# Patient Record
Sex: Male | Born: 1975 | Race: Black or African American | Hispanic: No | Smoking: Current every day smoker
Health system: Southern US, Community
[De-identification: ages and names within clinical notes are randomized; demographics above are authoritative.]

## PROBLEM LIST (undated history)

## (undated) DIAGNOSIS — G43909 Migraine, unspecified, not intractable, without status migrainosus: Secondary | ICD-10-CM

## (undated) DIAGNOSIS — M1990 Unspecified osteoarthritis, unspecified site: Secondary | ICD-10-CM

## (undated) DIAGNOSIS — F32A Depression, unspecified: Secondary | ICD-10-CM

## (undated) HISTORY — DX: Migraine, unspecified, not intractable, without status migrainosus: G43.909

## (undated) HISTORY — DX: Depression, unspecified: F32.A

## (undated) HISTORY — DX: Unspecified osteoarthritis, unspecified site: M19.90

---

## 2000-11-21 ENCOUNTER — Encounter: Payer: Self-pay | Admitting: Emergency Medicine

## 2000-11-21 ENCOUNTER — Emergency Department (HOSPITAL_COMMUNITY): Admission: EM | Admit: 2000-11-21 | Discharge: 2000-11-21 | Payer: Self-pay | Admitting: Emergency Medicine

## 2000-11-23 ENCOUNTER — Emergency Department (HOSPITAL_COMMUNITY): Admission: EM | Admit: 2000-11-23 | Discharge: 2000-11-23 | Payer: Self-pay | Admitting: Emergency Medicine

## 2000-11-29 ENCOUNTER — Emergency Department (HOSPITAL_COMMUNITY): Admission: EM | Admit: 2000-11-29 | Discharge: 2000-11-29 | Payer: Self-pay | Admitting: Emergency Medicine

## 2005-03-07 ENCOUNTER — Emergency Department (HOSPITAL_COMMUNITY): Admission: EM | Admit: 2005-03-07 | Discharge: 2005-03-07 | Payer: Self-pay | Admitting: Family Medicine

## 2008-02-26 ENCOUNTER — Emergency Department (HOSPITAL_COMMUNITY): Admission: EM | Admit: 2008-02-26 | Discharge: 2008-02-26 | Payer: Self-pay | Admitting: Family Medicine

## 2011-06-25 ENCOUNTER — Emergency Department (HOSPITAL_COMMUNITY)
Admission: EM | Admit: 2011-06-25 | Discharge: 2011-06-25 | Disposition: A | Discharge status: Home / Self Care | Payer: Medicaid Other | Source: Home / Self Care | Attending: Emergency Medicine | Admitting: Emergency Medicine

## 2011-06-25 ENCOUNTER — Emergency Department (INDEPENDENT_AMBULATORY_CARE_PROVIDER_SITE_OTHER): Payer: Medicaid Other

## 2011-06-25 ENCOUNTER — Encounter (HOSPITAL_COMMUNITY): Payer: Self-pay | Admitting: Emergency Medicine

## 2011-06-25 DIAGNOSIS — S43429A Sprain of unspecified rotator cuff capsule, initial encounter: Secondary | ICD-10-CM

## 2011-06-25 DIAGNOSIS — S46019A Strain of muscle(s) and tendon(s) of the rotator cuff of unspecified shoulder, initial encounter: Secondary | ICD-10-CM

## 2011-06-25 MED ORDER — TRAMADOL HCL 50 MG PO TABS
100.0000 mg | ORAL_TABLET | Freq: Three times a day (TID) | ORAL | Status: AC | PRN
Start: 2011-06-25 — End: 2011-07-05

## 2011-06-25 MED ORDER — MELOXICAM 15 MG PO TABS: 15.0000 mg | ORAL_TABLET | Freq: Every day | ORAL | Status: DC

## 2011-06-25 NOTE — ED Notes (Signed)
Pt having pain in right shoulder for 2 weeks. It hurts mostly with movement. Pt was helping move a table 2 weeks ago when the other person dropped their side and his right arm was pulled down.

## 2011-06-25 NOTE — ED Provider Notes (Signed)
Chief Complaint  Patient presents with  . Shoulder Pain    History of Present Illness:   Mr. Matthew Grimes is a 36 year old male who injured his shoulders 2 weeks ago. He was helping his brother lift a heavy piece of furniture when his brother slipped, then all the weight of the piece of furniture fell on his right arm. Thereafter he developed generalized pain in the shoulder with pain on any movement particularly abduction. He notes weakness of abduction. There is no pain radiating down the arm, numbness, tingling, or weakness.  Review of Systems:  Other than noted above, the patient denies any of the following symptoms: Systemic:  No fevers, chills, sweats, or aches.  No fatigue or tiredness. Musculoskeletal:  No joint pain, arthritis, bursitis, swelling, back pain, or neck pain. Neurological:  No muscular weakness, paresthesias, headache, or trouble with speech or coordination.  No dizziness.   PMFSH:  Past medical history, family history, social history, meds, and allergies were reviewed.  Physical Exam:   Vital signs:  BP 142/83  Pulse 49  Temp(Src) 97.8 F (36.6 C) (Oral)  Resp 12  SpO2 99% Gen:  Alert and oriented times 3.  In no distress. Musculoskeletal: There was diffuse pain to palpation over the entire shoulder. The shoulder had a limited active range of motion with pain, but a full passive range of motion with slight pain. Muscle strength was diminished for abduction. Impingement signs are positive. Otherwise, all joints had a full a ROM with no swelling, bruising or deformity.  No edema, pulses full. Extremities were warm and pink.  Capillary refill was brisk.  Skin:  Clear, warm and dry.  No rash. Neuro:  Alert and oriented times 3.  Muscle strength was normal.  Sensation was intact to light touch.   Radiology:  Dg Shoulder Right  06/25/2011  *RADIOLOGY REPORT*  Clinical Data: 36 year old male with right shoulder pain.  RIGHT SHOULDER - 2+ VIEW  Comparison: None  Findings: There  is no evidence of acute bony abnormality. There is no evidence of acute fracture, subluxation, or dislocation. No focal bony lesions are identified. The visualized right hemithorax is unremarkable.  IMPRESSION: Unremarkable right shoulder.  Original Report Authenticated By: Rosendo Gros, M.D.    Assessment:   Diagnoses that have been ruled out:  None  Diagnoses that are still under consideration:  None  Final diagnoses:  Rotator cuff strain    Plan:   1.  The following meds were prescribed:   New Prescriptions   MELOXICAM (MOBIC) 15 MG TABLET    Take 1 tablet (15 mg total) by mouth daily.   TRAMADOL (ULTRAM) 50 MG TABLET    Take 2 tablets (100 mg total) by mouth every 8 (eight) hours as needed for pain.   2.  The patient was instructed in symptomatic care, including rest and activity, elevation, application of ice and compression.  Appropriate handouts were given. 3.  The patient was told to return if becoming worse in any way, if no better in 3 or 4 days, and given some red flag symptoms that would indicate earlier return.   4.  The patient was told to follow up with Dr. Isaias Cowman in one to 2 days.   Roque Lias, MD 06/25/11 [phone]

## 2011-06-25 NOTE — Discharge Instructions (Signed)
Ligament Sprain Ligaments are tough, fibrous tissues that hold bones together at the joints. A sprain can occur when a ligament is stretched. This injury may take several weeks to heal. HOME CARE INSTRUCTIONS   Rest the injured area for as long as directed by your caregiver. Then slowly start using the joint as directed by your caregiver and as the pain allows.   Keep the affected joint raised if possible to lessen swelling.   Apply ice for 15 to 20 minutes to the injured area every couple hours for the first half day, then 3 to 4 times per day for the first 48 hours. Put the ice in a plastic bag and place a towel between the bag of ice and your skin.   Wear any splinting, casting, or elastic bandage applications as instructed.   Only take over-the-counter or prescription medicines for pain, discomfort, or fever as directed by your caregiver. Do not use aspirin immediately after the injury unless instructed by your caregiver. Aspirin can cause increased bleeding and bruising of the tissues.   If you were given crutches, continue to use them as instructed and do not resume weight bearing on the affected extremity until instructed.  SEEK MEDICAL CARE IF:   Your bruising, swelling, or pain increases.   You have cold and numb fingers or toes if your arm or leg was injured.  SEEK IMMEDIATE MEDICAL CARE IF:   Your toes are numb or blue if your leg was injured.   Your fingers are numb or blue if your arm was injured.   Your pain is not responding to medicines and continues to stay the same or gets worse.  MAKE SURE YOU:   Understand these instructions.   Will watch your condition.   Will get help right away if you are not doing well or get worse.  Document Released: 04/14/2000 Document Revised: 12/28/2010 Document Reviewed: 02/11/2008 [facility] Patient Information 2012 [place], [place].

## 2011-12-12 ENCOUNTER — Encounter (HOSPITAL_COMMUNITY): Payer: Self-pay | Admitting: *Deleted

## 2011-12-12 ENCOUNTER — Emergency Department (HOSPITAL_COMMUNITY)
Admission: EM | Admit: 2011-12-12 | Discharge: 2011-12-13 | Disposition: A | Discharge status: Home / Self Care | Payer: Medicaid Other | Attending: Emergency Medicine | Admitting: Emergency Medicine

## 2011-12-12 DIAGNOSIS — L723 Sebaceous cyst: Secondary | ICD-10-CM | POA: Insufficient documentation

## 2011-12-12 DIAGNOSIS — R229 Localized swelling, mass and lump, unspecified: Secondary | ICD-10-CM

## 2011-12-12 NOTE — ED Notes (Signed)
Pt has possible abscess to right hip area

## 2011-12-12 NOTE — ED Notes (Signed)
Rt hip insect bite 2 days ago.  C/o pain

## 2011-12-12 NOTE — ED Provider Notes (Signed)
History     CSN: [id number]  Arrival date & time 12/12/11  2120   First MD Initiated Contact with Patient 12/12/11 2200      Chief Complaint  Patient presents with  . Insect Bite    (Consider location/radiation/quality/duration/timing/severity/associated sxs/prior treatment) HPI Comments: Patient with no significant past medical history presents emergency department with chief complaint of right insect bite.  He states that he felt a funny sensation in his skin at his right hip while he was sleeping at night and that over the last 2 days it is gradually worsened.  He denies a history of abscesses and does not recall seeing any sort of insect.  Patient states his skin is intact but there is a bump on his right hip that is tender to palpation and it is not allow him to sleep on that side.  Severity of the pain is rated at 7/10.  Patient denies having any comorbidities, fever, night sweats, chills, rash, nuchal rigidity, myalgias, headache, change in vision.  No other complaints at this time.  The history is provided by the patient.    History reviewed. No pertinent past medical history.  History reviewed. No pertinent past surgical history.  No family history on file.  History  Substance Use Topics  . Smoking status: Never Smoker   . Smokeless tobacco: Not on file  . Alcohol Use: No      Review of Systems  All other systems reviewed and are negative.    Allergies  Review of patient's allergies indicates no known allergies.  Home Medications  No current outpatient prescriptions on file.  BP 139/92  Pulse 60  Temp 98 F (36.7 C) (Oral)  Resp 16  SpO2 100%  Physical Exam  Nursing note and vitals reviewed. Constitutional: He is oriented to person, place, and time. He appears well-developed and well-nourished. No distress.  HENT:  Head: Normocephalic and atraumatic.  Eyes: Conjunctivae and EOM are normal.  Neck: Normal range of motion.  Pulmonary/Chest: Effort  normal.  Musculoskeletal: Normal range of motion.  Neurological: He is alert and oriented to person, place, and time.  Skin: Skin is warm and dry. No rash noted. He is not diaphoretic.       Small Superficial nodule located on right hip that is tender to palpation.  Nodule measures approximately 3-50mm round, tender to palpation, not fixated and with questionable fluctuance.  No surrounding erythema.  Psychiatric: He has a normal mood and affect. His behavior is normal.    ED Course  Procedures (including critical care time)  Labs Reviewed - No data to display No results found.   No diagnosis found.  INCISION & cyst removal Performed by: Jaci Carrel Consent: Verbal consent obtained. Risks and benefits: risks, benefits and alternatives were discussed Type: Cyst removal  Body area: Right hip  Anesthesia: local infiltration  Local anesthetic: lidocaine 2% w epinephrine  Anesthetic total: 1.5 ml  Drainage amount: none- Removed cyst without complications  Patient tolerance: Patient tolerated the procedure well with no immediate complications.     MDM  Superficial tender nodule   Nodule excised and removed. Recommended Follow up with PCP if another nodule develops, wound re-check in 3-7 days.Strict return precautions discussed. Non concerning for CA d/t presentation, pt age, nodule size and history.          Jaci Carrel, [place] 12/12/11 2323

## 2011-12-15 NOTE — ED Provider Notes (Signed)
Medical screening examination/treatment/procedure(s) were performed by non-physician practitioner and as supervising physician I was immediately available for consultation/collaboration.  Flint Melter, MD 12/15/11 [phone]

## 2012-02-05 ENCOUNTER — Encounter (HOSPITAL_COMMUNITY): Payer: Self-pay | Admitting: *Deleted

## 2012-02-05 ENCOUNTER — Emergency Department (HOSPITAL_COMMUNITY)
Admission: EM | Admit: 2012-02-05 | Discharge: 2012-02-05 | Disposition: A | Discharge status: Home Health | Payer: Medicaid Other | Attending: Emergency Medicine | Admitting: Emergency Medicine

## 2012-02-05 DIAGNOSIS — N390 Urinary tract infection, site not specified: Secondary | ICD-10-CM

## 2012-02-05 LAB — CBC WITH DIFFERENTIAL/PLATELET
Basophils Relative: 0 % (ref 0–1)
HCT: 35.3 % — ABNORMAL LOW (ref 39.0–52.0)
Hemoglobin: 12.6 g/dL — ABNORMAL LOW (ref 13.0–17.0)
MCH: 31.1 pg (ref 26.0–34.0)
MCHC: 35.7 g/dL (ref 30.0–36.0)
MCV: 87.2 fL (ref 78.0–100.0)
Monocytes Absolute: 0.7 10*3/uL (ref 0.1–1.0)
Monocytes Relative: 5 % (ref 3–12)
Neutro Abs: 11.3 10*3/uL — ABNORMAL HIGH (ref 1.7–7.7)

## 2012-02-05 LAB — COMPREHENSIVE METABOLIC PANEL
Albumin: 4.4 g/dL (ref 3.5–5.2)
BUN: 9 mg/dL (ref 6–23)
Chloride: 103 mEq/L (ref 96–112)
Creatinine, Ser: 0.76 mg/dL (ref 0.50–1.35)
GFR calc non Af Amer: >90 mL/min (ref >90)
Total Bilirubin: 0.5 mg/dL (ref 0.3–1.2)

## 2012-02-05 LAB — URINALYSIS, ROUTINE W REFLEX MICROSCOPIC
Bilirubin Urine: NEGATIVE
Glucose, UA: NEGATIVE mg/dL
Ketones, ur: 15 mg/dL — AB
Nitrite: NEGATIVE
Protein, ur: 100 mg/dL — AB
Specific Gravity, Urine: 1.017 (ref 1.005–1.030)
Urobilinogen, UA: 1 mg/dL (ref 0.0–1.0)
pH: 6 (ref 5.0–8.0)

## 2012-02-05 LAB — URINE MICROSCOPIC-ADD ON

## 2012-02-05 MED ORDER — METRONIDAZOLE 500 MG PO TABS
2000.0000 mg | ORAL_TABLET | Freq: Once | ORAL | Status: AC
  Administered 2012-02-05: 2000 mg via ORAL
  Filled 2012-02-05: qty 4

## 2012-02-05 MED ORDER — CIPROFLOXACIN HCL 500 MG PO TABS: 500.0000 mg | ORAL_TABLET | Freq: Two times a day (BID) | ORAL | Status: DC

## 2012-02-05 MED ORDER — AZITHROMYCIN 250 MG PO TABS
1000.0000 mg | ORAL_TABLET | Freq: Once | ORAL | Status: AC
  Administered 2012-02-05: 1000 mg via ORAL
  Filled 2012-02-05: qty 4

## 2012-02-05 MED ORDER — CEFTRIAXONE SODIUM 250 MG IJ SOLR
250.0000 mg | Freq: Once | INTRAMUSCULAR | Status: AC
  Administered 2012-02-05: 250 mg via INTRAMUSCULAR
  Filled 2012-02-05: qty 250

## 2012-02-05 NOTE — ED Notes (Signed)
Lower abd pain and bloody urine just today.  No nv or diarrhea

## 2012-02-05 NOTE — ED Notes (Signed)
He is also c/o bloody urine

## 2012-02-05 NOTE — ED Provider Notes (Signed)
History     CSN: [id number]  Arrival date & time 02/05/12  1646   First MD Initiated Contact with Patient 02/05/12 2106      Chief Complaint  Patient presents with  . Abdominal Pain   HPI  History provided by the patient. Patient is a 36 year old male with no significant PMH who presents with complaints of lower abdomen discomfort and hematuria. Patient reports having slight lower abdomen discomfort and occasional pains for the past several days. Today patient states he noticed blood in his urine for the first time. Patient denies having similar symptoms previously. Patient is sexually active with one partner. He has no prior history of STD. He denies any penile discharge, testicle tenderness or testicle swelling. He denies any rash of the skin. He denies any flank pain, nausea or vomiting. He denies any fever, chills or sweats.    History reviewed. No pertinent past medical history.  History reviewed. No pertinent past surgical history.  No family history on file.  History  Substance Use Topics  . Smoking status: Never Smoker   . Smokeless tobacco: Not on file  . Alcohol Use: No      Review of Systems  Constitutional: Negative for fever, chills, diaphoresis and appetite change.  Cardiovascular: Negative for chest pain.  Gastrointestinal: Positive for abdominal pain. Negative for nausea, vomiting, diarrhea, constipation, blood in stool and rectal pain.  Genitourinary: Positive for hematuria. Negative for dysuria, frequency, flank pain, discharge, penile swelling, scrotal swelling, penile pain and testicular pain.  Skin: Negative for rash.    Allergies  Review of patient's allergies indicates no known allergies.  Home Medications  No current outpatient prescriptions on file.  BP 123/82  Pulse 64  Temp 98.4 F (36.9 C) (Oral)  Resp 18  SpO2 100%  Physical Exam  Nursing note and vitals reviewed. Constitutional: He is oriented to person, place, and time. He appears  well-developed and well-nourished. No distress.  HENT:  Head: Normocephalic.  Cardiovascular: Normal rate and regular rhythm.   Pulmonary/Chest: Effort normal and breath sounds normal. No respiratory distress. He has no wheezes. He has no rales.  Abdominal: Soft. He exhibits no distension and no mass. There is tenderness in the suprapubic area. There is no rigidity, no rebound, no guarding, no CVA tenderness, no tenderness at McBurney's point and negative Murphy's sign.  Genitourinary: Rectum normal, prostate normal, testes normal and penis normal. Right testis shows no mass, no swelling and no tenderness. Left testis shows no mass, no swelling and no tenderness. No penile tenderness.  Neurological: He is alert and oriented to person, place, and time.  Skin: Skin is warm. No rash noted.  Psychiatric: He has a normal mood and affect. His behavior is normal.    ED Course  Procedures  Results for orders placed during the hospital encounter of 02/05/12  URINALYSIS, ROUTINE W REFLEX MICROSCOPIC      Component Value Range   Color, Urine RED (*) YELLOW   APPearance CLOUDY (*) CLEAR   Specific Gravity, Urine 1.017  1.005 - 1.030   pH 6.0  5.0 - 8.0   Glucose, UA NEGATIVE  NEGATIVE mg/dL   Hgb urine dipstick LARGE (*) NEGATIVE   Bilirubin Urine NEGATIVE  NEGATIVE   Ketones, ur 15 (*) NEGATIVE mg/dL   Protein, ur 213 (*) NEGATIVE mg/dL   Urobilinogen, UA 1.0  0.0 - 1.0 mg/dL   Nitrite NEGATIVE  NEGATIVE   Leukocytes, UA MODERATE (*) NEGATIVE  CBC WITH DIFFERENTIAL  Component Value Range   WBC 14.2 (*) 4.0 - 10.5 K/uL   RBC 4.05 (*) 4.22 - 5.81 MIL/uL   Hemoglobin 12.6 (*) 13.0 - 17.0 g/dL   HCT 95.6 (*) 21.3 - 08.6 %   MCV 87.2  78.0 - 100.0 fL   MCH 31.1  26.0 - 34.0 pg   MCHC 35.7  30.0 - 36.0 g/dL   RDW 57.8  46.9 - 62.9 %   Platelets 218  150 - 400 K/uL   Neutrophils Relative 79 (*) 43 - 77 %   Neutro Abs 11.3 (*) 1.7 - 7.7 K/uL   Lymphocytes Relative 15  12 - 46 %   Lymphs  Abs 2.1  0.7 - 4.0 K/uL   Monocytes Relative 5  3 - 12 %   Monocytes Absolute 0.7  0.1 - 1.0 K/uL   Eosinophils Relative 1  0 - 5 %   Eosinophils Absolute 0.2  0.0 - 0.7 K/uL   Basophils Relative 0  0 - 1 %   Basophils Absolute 0.0  0.0 - 0.1 K/uL  COMPREHENSIVE METABOLIC PANEL      Component Value Range   Sodium 136  135 - 145 mEq/L   Potassium 3.7  3.5 - 5.1 mEq/L   Chloride 103  96 - 112 mEq/L   CO2 23  19 - 32 mEq/L   Glucose, Bld 88  70 - 99 mg/dL   BUN 9  6 - 23 mg/dL   Creatinine, Ser 5.28  0.50 - 1.35 mg/dL   Calcium 9.4  8.4 - 41.3 mg/dL   Total Protein 7.3  6.0 - 8.3 g/dL   Albumin 4.4  3.5 - 5.2 g/dL   AST 13  0 - 37 U/L   ALT 8  0 - 53 U/L   Alkaline Phosphatase 55  39 - 117 U/L   Total Bilirubin 0.5  0.3 - 1.2 mg/dL   GFR calc non Af Amer >90  >90 mL/min   GFR calc Af Amer >90  >90 mL/min  URINE MICROSCOPIC-ADD ON      Component Value Range   Squamous Epithelial / LPF FEW (*) RARE   WBC, UA TOO NUMEROUS TO COUNT  <3 WBC/hpf   RBC / HPF TOO NUMEROUS TO COUNT  <3 RBC/hpf   Bacteria, UA FEW (*) RARE        1. UTI (lower urinary tract infection)       MDM  9:20PM patient seen and evaluated. Patient appears comfortable in no acute distress. Abdomen is soft with very mild suprapubic tenderness. No CVA tenderness.   Patient with normal renal function. Patient does not appear in any significant discomfort. Doubt kidney stone. UA is concerning for UTI. Given patient's age I have concerned for possible STD. GC Chlamydia test were sent. Will give treatment for GC Chlamydia and Trichomonas today. Patient given instructions to followup with [facility] department. We'll also give prescription for Cipro x7 days and urology referral.     Angus Seller, PA 02/06/12 [phone]

## 2012-02-08 LAB — URINE CULTURE: Colony Count: 45000

## 2012-02-08 NOTE — ED Provider Notes (Signed)
Medical screening examination/treatment/procedure(s) were performed by non-physician practitioner and as supervising physician I was immediately available for consultation/collaboration.   Joya Gaskins, MD 02/08/12 [phone]

## 2012-02-09 NOTE — ED Notes (Signed)
+   urine  Patient treated appropriately -sensitive to same-chart appended per protocol MD.  

## 2012-12-04 ENCOUNTER — Encounter (HOSPITAL_COMMUNITY): Payer: Self-pay | Admitting: Emergency Medicine

## 2012-12-04 ENCOUNTER — Emergency Department (HOSPITAL_COMMUNITY)
Admission: EM | Admit: 2012-12-04 | Discharge: 2012-12-04 | Disposition: A | Discharge status: Home / Self Care | Payer: Medicaid Other | Attending: Emergency Medicine | Admitting: Emergency Medicine

## 2012-12-04 DIAGNOSIS — K0889 Other specified disorders of teeth and supporting structures: Secondary | ICD-10-CM

## 2012-12-04 DIAGNOSIS — K089 Disorder of teeth and supporting structures, unspecified: Secondary | ICD-10-CM | POA: Insufficient documentation

## 2012-12-04 MED ORDER — HYDROCODONE-ACETAMINOPHEN 5-325 MG PO TABS: 1.0000 | ORAL_TABLET | ORAL | Status: DC | PRN

## 2012-12-04 MED ORDER — PENICILLIN V POTASSIUM 500 MG PO TABS: 500.0000 mg | ORAL_TABLET | Freq: Four times a day (QID) | ORAL | Status: AC

## 2012-12-04 NOTE — ED Provider Notes (Signed)
  Medical screening examination/treatment/procedure(s) were performed by non-physician practitioner and as supervising physician I was immediately available for consultation/collaboration.    Rickiya Picariello, MD 12/04/12 1600 

## 2012-12-04 NOTE — ED Notes (Signed)
Pt c/o left sided dental pain x 3 days.

## 2012-12-04 NOTE — ED Provider Notes (Signed)
  CSN: [id number]     Arrival date & time 12/04/12  0715 History     First MD Initiated Contact with Patient 12/04/12 0831     Chief Complaint  Patient presents with  . Abscess  . Dental Pain   (Consider location/radiation/quality/duration/timing/severity/associated sxs/prior Treatment) The history is provided by the patient and medical records.   Patient resents to the ED for left upper and lower dental pain x3 days. Patient states pain started after eating, and is exacerbated by chewing on the affected side.  Denies any breakage of teeth or mucosal injury.  Denies any numbness or paresthesias of face. No fevers, sweats, or chills. Patient is not currently established with a dentist.  No meds taken prior to arrival. No hx of dental abscess.  History reviewed. No pertinent past medical history. History reviewed. No pertinent past surgical history. History reviewed. No pertinent family history. History  Substance Use Topics  . Smoking status: Never Smoker   . Smokeless tobacco: Not on file  . Alcohol Use: No    Review of Systems  HENT: Positive for dental problem.   All other systems reviewed and are negative.    Allergies  Review of patient's allergies indicates no known allergies.  Home Medications  No current outpatient prescriptions on file. BP 131/79  Pulse 61  Temp(Src) 98.1 F (36.7 C) (Oral)  Resp 18  SpO2 99%   Physical Exam  Nursing note and vitals reviewed. Constitutional: He is oriented to person, place, and time. He appears well-developed and well-nourished.  HENT:  Head: Normocephalic and atraumatic. No trismus in the jaw.  Mouth/Throat: Uvula is midline, oropharynx is clear and moist and mucous membranes are normal. No oral lesions. Abnormal dentition. Dental caries present. No oropharyngeal exudate, posterior oropharyngeal edema, posterior oropharyngeal erythema or tonsillar abscesses.    Teeth largely in poor dentition, multiples cavities present,  left upper pre-molar broken and flush with gum, surrounding gingiva swollen and discolored, handling secretions appropriately, no trismus  Eyes: Conjunctivae and EOM are normal.  Neck: Normal range of motion. Neck supple.  Cardiovascular: Normal rate, regular rhythm and normal heart sounds.   Pulmonary/Chest: Effort normal and breath sounds normal.  Musculoskeletal: Normal range of motion.  Neurological: He is alert and oriented to person, place, and time.  Skin: Skin is warm and dry.  Psychiatric: He has a normal mood and affect.    ED Course   Procedures (including critical care time)  Labs Reviewed - No data to display No results found.  1. Pain, dental     MDM   Dental pain with concern for early dental abscess.  Rx pen VK and vicodin.  FU with dentist-- community lists given.  Discussed plan with pt, he agreed.  Return precautions advised.  Garlon Hatchet, PA-C 12/04/12 [phone]

## 2014-12-17 ENCOUNTER — Encounter (HOSPITAL_COMMUNITY): Payer: Self-pay | Admitting: Family Medicine

## 2014-12-17 ENCOUNTER — Emergency Department (HOSPITAL_COMMUNITY): Payer: Medicaid Other

## 2014-12-17 ENCOUNTER — Emergency Department (HOSPITAL_COMMUNITY)
Admission: EM | Admit: 2014-12-17 | Discharge: 2014-12-17 | Disposition: A | Discharge status: Home / Self Care | Payer: Medicaid Other | Attending: Emergency Medicine | Admitting: Emergency Medicine

## 2014-12-17 DIAGNOSIS — R3919 Other difficulties with micturition: Secondary | ICD-10-CM | POA: Diagnosis not present

## 2014-12-17 DIAGNOSIS — R1084 Generalized abdominal pain: Secondary | ICD-10-CM | POA: Insufficient documentation

## 2014-12-17 DIAGNOSIS — R300 Dysuria: Secondary | ICD-10-CM | POA: Diagnosis not present

## 2014-12-17 DIAGNOSIS — R109 Unspecified abdominal pain: Secondary | ICD-10-CM | POA: Diagnosis present

## 2014-12-17 DIAGNOSIS — M549 Dorsalgia, unspecified: Secondary | ICD-10-CM | POA: Insufficient documentation

## 2014-12-17 DIAGNOSIS — R112 Nausea with vomiting, unspecified: Secondary | ICD-10-CM | POA: Diagnosis not present

## 2014-12-17 DIAGNOSIS — R197 Diarrhea, unspecified: Secondary | ICD-10-CM | POA: Diagnosis not present

## 2014-12-17 LAB — COMPREHENSIVE METABOLIC PANEL
ALK PHOS: 53 U/L (ref 38–126)
ALT: 17 U/L (ref 17–63)
ANION GAP: 6 (ref 5–15)
AST: 19 U/L (ref 15–41)
Albumin: 4.2 g/dL (ref 3.5–5.0)
BILIRUBIN TOTAL: 0.5 mg/dL (ref 0.3–1.2)
BUN: 10 mg/dL (ref 6–20)
CALCIUM: 9.7 mg/dL (ref 8.9–10.3)
CO2: 25 mmol/L (ref 22–32)
CREATININE: 0.87 mg/dL (ref 0.61–1.24)
Chloride: 110 mmol/L (ref 101–111)
Glucose, Bld: 73 mg/dL (ref 65–99)
Potassium: 4.4 mmol/L (ref 3.5–5.1)
SODIUM: 141 mmol/L (ref 135–145)
TOTAL PROTEIN: 7.6 g/dL (ref 6.5–8.1)

## 2014-12-17 LAB — CBC
HCT: 42.1 % (ref 39.0–52.0)
HEMOGLOBIN: 14.4 g/dL (ref 13.0–17.0)
MCH: 31.9 pg (ref 26.0–34.0)
MCHC: 34.2 g/dL (ref 30.0–36.0)
MCV: 93.1 fL (ref 78.0–100.0)
PLATELETS: 287 10*3/uL (ref 150–400)
RBC: 4.52 MIL/uL (ref 4.22–5.81)
RDW: 15 % (ref 11.5–15.5)
WBC: 7.1 10*3/uL (ref 4.0–10.5)

## 2014-12-17 LAB — URINALYSIS, ROUTINE W REFLEX MICROSCOPIC
Bilirubin Urine: NEGATIVE
GLUCOSE, UA: NEGATIVE mg/dL
HGB URINE DIPSTICK: NEGATIVE
KETONES UR: 15 mg/dL — AB
LEUKOCYTES UA: NEGATIVE
Nitrite: NEGATIVE
PROTEIN: NEGATIVE mg/dL
Specific Gravity, Urine: 1.028 (ref 1.005–1.030)
UROBILINOGEN UA: 1 mg/dL (ref 0.0–1.0)
pH: 6 (ref 5.0–8.0)

## 2014-12-17 LAB — LIPASE, BLOOD: Lipase: 31 U/L (ref 22–51)

## 2014-12-17 MED ORDER — NAPROXEN 500 MG PO TABS: 500.0000 mg | ORAL_TABLET | Freq: Two times a day (BID) | ORAL | Status: DC

## 2014-12-17 MED ORDER — SODIUM CHLORIDE 0.9 % IV BOLUS (SEPSIS)
250.0000 mL | Freq: Once | INTRAVENOUS | Status: AC
Start: 2014-12-17 — End: 2014-12-17
  Administered 2014-12-17: 250 mL via INTRAVENOUS

## 2014-12-17 MED ORDER — IOHEXOL 300 MG/ML  SOLN
25.0000 mL | Freq: Once | INTRAMUSCULAR | Status: AC | PRN
  Administered 2014-12-17: 25 mL via ORAL

## 2014-12-17 MED ORDER — IOHEXOL 300 MG/ML  SOLN: 25.0000 mL | Freq: Once | INTRAMUSCULAR | Status: DC | PRN

## 2014-12-17 MED ORDER — PROMETHAZINE HCL 25 MG PO TABS: 25.0000 mg | ORAL_TABLET | Freq: Four times a day (QID) | ORAL | Status: DC | PRN

## 2014-12-17 MED ORDER — ONDANSETRON HCL 4 MG/2ML IJ SOLN
4.0000 mg | Freq: Once | INTRAMUSCULAR | Status: AC
  Administered 2014-12-17: 4 mg via INTRAVENOUS
  Filled 2014-12-17: qty 2

## 2014-12-17 MED ORDER — SODIUM CHLORIDE 0.9 % IV SOLN: INTRAVENOUS | Status: DC

## 2014-12-17 MED ORDER — IOHEXOL 300 MG/ML  SOLN
100.0000 mL | Freq: Once | INTRAMUSCULAR | Status: AC | PRN
  Administered 2014-12-17: 100 mL via INTRAVENOUS

## 2014-12-17 MED ORDER — FENTANYL CITRATE (PF) 100 MCG/2ML IJ SOLN
50.0000 ug | Freq: Once | INTRAMUSCULAR | Status: AC
Start: 2014-12-17 — End: 2014-12-17
  Administered 2014-12-17: 50 ug via INTRAVENOUS
  Filled 2014-12-17: qty 2

## 2014-12-17 NOTE — ED Notes (Signed)
Pt here for abd pain aand back pain. sts his kidneys hurt. sts also right groin pain, sts N,V,D.

## 2014-12-17 NOTE — ED Notes (Signed)
Patient transported to CT 

## 2014-12-17 NOTE — ED Provider Notes (Signed)
CSN: [id number]     Arrival date & time 12/17/14  1450 History   First MD Initiated Contact with Patient 12/17/14 1614     Chief Complaint  Patient presents with  . Abdominal Pain  . Back Pain     (Consider location/radiation/quality/duration/timing/severity/associated sxs/prior Treatment) Patient is a 39 y.o. male presenting with abdominal pain and back pain. The history is provided by the patient and a significant other.  Abdominal Pain Associated symptoms: diarrhea, dysuria, nausea and vomiting   Associated symptoms: no chest pain, no fever and no hematuria   Back Pain Associated symptoms: abdominal pain and dysuria   Associated symptoms: no chest pain, no fever and no headaches    patient with complaint of lower abdominal pain worse with urination radiating to bilateral back area for 3 days. Currently 5 out of 10 at its worse is 10 out of 10 no fever no hematuria but does have discomfort with urination. Associated with nausea vomiting and diarrhea vomited once today no diarrhea today. No blood in either one. No history of similar problems. Pain is described as sharp.  History reviewed. No pertinent past medical history. History reviewed. No pertinent past surgical history. History reviewed. No pertinent family history. Social History  Substance Use Topics  . Smoking status: Never Smoker   . Smokeless tobacco: None  . Alcohol Use: No    Review of Systems  Constitutional: Negative for fever.  HENT: Negative for congestion.   Eyes: Negative for visual disturbance.  Cardiovascular: Negative for chest pain.  Gastrointestinal: Positive for nausea, vomiting, abdominal pain and diarrhea.  Genitourinary: Positive for dysuria and difficulty urinating. Negative for hematuria.  Musculoskeletal: Positive for back pain.  Skin: Negative for rash.  Neurological: Negative for headaches.  Hematological: Does not bruise/bleed easily.  Psychiatric/Behavioral: Negative for confusion.       Allergies  Review of patient's allergies indicates no known allergies.  Home Medications   Prior to Admission medications   Medication Sig Start Date End Date Taking? Authorizing Provider  HYDROcodone-acetaminophen (NORCO/VICODIN) 5-325 MG per tablet Take 1 tablet by mouth every 4 (four) hours as needed for pain. Patient not taking: Reported on 12/17/2014 12/04/12   Garlon Hatchet, PA-C  naproxen (NAPROSYN) 500 MG tablet Take 1 tablet (500 mg total) by mouth 2 (two) times daily. 12/17/14   Vanetta Mulders, MD  promethazine (PHENERGAN) 25 MG tablet Take 1 tablet (25 mg total) by mouth every 6 (six) hours as needed for nausea or vomiting. 12/17/14   Vanetta Mulders, MD   BP 109/74 mmHg  Pulse 46  Temp(Src) 97.8 F (36.6 C) (Oral)  Resp 18  SpO2 99% Physical Exam  Constitutional: He is oriented to person, place, and time. He appears well-developed and well-nourished. No distress.  HENT:  Head: Normocephalic and atraumatic.  Mouth/Throat: Oropharynx is clear and moist.  Eyes: Conjunctivae and EOM are normal. Pupils are equal, round, and reactive to light.  Neck: Normal range of motion. Neck supple.  Cardiovascular: Normal rate, regular rhythm and normal heart sounds.   No murmur heard. Pulmonary/Chest: Effort normal and breath sounds normal. No respiratory distress.  Abdominal: Soft. Bowel sounds are normal. There is no tenderness.  Musculoskeletal: Normal range of motion. He exhibits no edema.  Neurological: He is alert and oriented to person, place, and time. No cranial nerve deficit. He exhibits normal muscle tone. Coordination normal.  Skin: Skin is warm. No rash noted.  Nursing note and vitals reviewed.   ED Course  Procedures (including  critical care time) Labs Review Labs Reviewed  URINALYSIS, ROUTINE W REFLEX MICROSCOPIC (NOT AT [facility]) - Abnormal; Notable for the following:    Ketones, ur 15 (*)    All other components within normal limits  LIPASE, BLOOD   COMPREHENSIVE METABOLIC PANEL  CBC   Results for orders placed or performed during the hospital encounter of 12/17/14  Lipase, blood  Result Value Ref Range   Lipase 31 22 - 51 U/L  Comprehensive metabolic panel  Result Value Ref Range   Sodium 141 135 - 145 mmol/L   Potassium 4.4 3.5 - 5.1 mmol/L   Chloride 110 101 - 111 mmol/L   CO2 25 22 - 32 mmol/L   Glucose, Bld 73 65 - 99 mg/dL   BUN 10 6 - 20 mg/dL   Creatinine, Ser 1.61 0.61 - 1.24 mg/dL   Calcium 9.7 8.9 - 09.6 mg/dL   Total Protein 7.6 6.5 - 8.1 g/dL   Albumin 4.2 3.5 - 5.0 g/dL   AST 19 15 - 41 U/L   ALT 17 17 - 63 U/L   Alkaline Phosphatase 53 38 - 126 U/L   Total Bilirubin 0.5 0.3 - 1.2 mg/dL   GFR calc non Af Amer >60 >60 mL/min   GFR calc Af Amer >60 >60 mL/min   Anion gap 6 5 - 15  CBC  Result Value Ref Range   WBC 7.1 4.0 - 10.5 K/uL   RBC 4.52 4.22 - 5.81 MIL/uL   Hemoglobin 14.4 13.0 - 17.0 g/dL   HCT 04.5 40.9 - 81.1 %   MCV 93.1 78.0 - 100.0 fL   MCH 31.9 26.0 - 34.0 pg   MCHC 34.2 30.0 - 36.0 g/dL   RDW 91.4 78.2 - 95.6 %   Platelets 287 150 - 400 K/uL  Urinalysis, Routine w reflex microscopic (not at [facility])  Result Value Ref Range   Color, Urine YELLOW YELLOW   APPearance CLEAR CLEAR   Specific Gravity, Urine 1.028 1.005 - 1.030   pH 6.0 5.0 - 8.0   Glucose, UA NEGATIVE NEGATIVE mg/dL   Hgb urine dipstick NEGATIVE NEGATIVE   Bilirubin Urine NEGATIVE NEGATIVE   Ketones, ur 15 (A) NEGATIVE mg/dL   Protein, ur NEGATIVE NEGATIVE mg/dL   Urobilinogen, UA 1.0 0.0 - 1.0 mg/dL   Nitrite NEGATIVE NEGATIVE   Leukocytes, UA NEGATIVE NEGATIVE     Imaging Review Ct Abdomen Pelvis W Contrast  12/17/2014   CLINICAL DATA:  Bilateral back pain, nausea, vomiting, diarrhea and constipation for the past 3 days.  EXAM: CT ABDOMEN AND PELVIS WITH CONTRAST  TECHNIQUE: Multidetector CT imaging of the abdomen and pelvis was performed using the standard protocol following bolus administration of intravenous  contrast.  CONTRAST:  OMNIPAQUE IOHEXOL 300 MG/ML  SOLN  COMPARISON:  None.  FINDINGS: 1.6 cm low density mass in the superior aspect of the left lobe of the liver on image number 7, with some peripheral contrast puddling. There is also a 1.8 cm hyperenhancing mass in the superior aspect of the right lobe on image 9. There is a 1.2 cm similar hyper enhancing mass in the lateral segment of the left lobe of the liver on image 14. Also noted are multiple small liver cysts.  Unremarkable spleen, pancreas, gallbladder, adrenal glands, kidneys, urinary bladder and prostate gland. No gastrointestinal abnormalities or enlarged lymph nodes. No evidence of appendicitis. Mild paraseptal emphysema in the left lower lobe. Mild degenerative changes at the L5-S1 level.  IMPRESSION: 1.  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 2. 2 additional hyperenhancing masses in the liver. These are nonspecific and most likely benign. These could be followed with adynamic pre and postcontrast enhanced CT or MR of the liver an 3-6 months. 3. Mild changes of COPD. 4. No acute abnormality.   Electronically Signed   By: Beckie Salts M.D.   On: 12/17/2014 21:43   I have personally reviewed and evaluated these images and lab results as part of my medical decision-making.   EKG Interpretation None      MDM   Final diagnoses:  Generalized abdominal pain      workup for the abdominal pain now without any specific findings. CT of the abdomen was negative no leukocytosis no evidence for urinary tract infection no liver function test or electrolyte abnormalities. Patient is improved. Will treat symptomatically with Naprosyn and Phenergan. patient stable for discharge home.   Vanetta Mulders, MD 12/17/14 [phone]

## 2014-12-17 NOTE — ED Notes (Signed)
Pt back from CT

## 2014-12-17 NOTE — Discharge Instructions (Signed)
Workup for the abdominal pain without evidence of any significant findings or infection. Take the Naprosyn as directed take the Phenergan as needed for nausea and vomiting. Return for any new or worse symptoms.

## 2015-10-08 ENCOUNTER — Encounter (HOSPITAL_COMMUNITY): Payer: Self-pay

## 2015-10-08 ENCOUNTER — Emergency Department (HOSPITAL_COMMUNITY)
Admission: EM | Admit: 2015-10-08 | Discharge: 2015-10-08 | Disposition: A | Discharge status: Home / Self Care | Payer: Medicaid Other | Attending: Emergency Medicine | Admitting: Emergency Medicine

## 2015-10-08 ENCOUNTER — Emergency Department (HOSPITAL_COMMUNITY): Payer: Medicaid Other

## 2015-10-08 DIAGNOSIS — Z79899 Other long term (current) drug therapy: Secondary | ICD-10-CM | POA: Insufficient documentation

## 2015-10-08 DIAGNOSIS — Z791 Long term (current) use of non-steroidal anti-inflammatories (NSAID): Secondary | ICD-10-CM | POA: Insufficient documentation

## 2015-10-08 DIAGNOSIS — Y929 Unspecified place or not applicable: Secondary | ICD-10-CM | POA: Insufficient documentation

## 2015-10-08 DIAGNOSIS — Y999 Unspecified external cause status: Secondary | ICD-10-CM | POA: Insufficient documentation

## 2015-10-08 DIAGNOSIS — L03116 Cellulitis of left lower limb: Secondary | ICD-10-CM

## 2015-10-08 DIAGNOSIS — Y939 Activity, unspecified: Secondary | ICD-10-CM | POA: Diagnosis not present

## 2015-10-08 DIAGNOSIS — W228XXA Striking against or struck by other objects, initial encounter: Secondary | ICD-10-CM | POA: Diagnosis not present

## 2015-10-08 DIAGNOSIS — M79675 Pain in left toe(s): Secondary | ICD-10-CM | POA: Diagnosis present

## 2015-10-08 DIAGNOSIS — L03032 Cellulitis of left toe: Secondary | ICD-10-CM | POA: Diagnosis not present

## 2015-10-08 LAB — CBG MONITORING, ED: GLUCOSE-CAPILLARY: 91 mg/dL (ref 65–99)

## 2015-10-08 MED ORDER — SULFAMETHOXAZOLE-TRIMETHOPRIM 800-160 MG PO TABS
1.0000 | ORAL_TABLET | Freq: Once | ORAL | Status: AC
  Administered 2015-10-08: 1 via ORAL
  Filled 2015-10-08: qty 1

## 2015-10-08 MED ORDER — TETANUS-DIPHTH-ACELL PERTUSSIS 5-2.5-18.5 LF-MCG/0.5 IM SUSP
0.5000 mL | Freq: Once | INTRAMUSCULAR | Status: AC
  Administered 2015-10-08: 0.5 mL via INTRAMUSCULAR
  Filled 2015-10-08: qty 0.5

## 2015-10-08 MED ORDER — HYDROCODONE-ACETAMINOPHEN 5-325 MG PO TABS: 1.0000 | ORAL_TABLET | ORAL | Status: DC | PRN

## 2015-10-08 MED ORDER — CEPHALEXIN 500 MG PO CAPS: 500.0000 mg | ORAL_CAPSULE | Freq: Four times a day (QID) | ORAL | Status: DC

## 2015-10-08 MED ORDER — LIDOCAINE HCL (PF) 1 % IJ SOLN
5.0000 mL | Freq: Once | INTRAMUSCULAR | Status: AC
Start: 2015-10-08 — End: 2015-10-08
  Administered 2015-10-08: 5 mL
  Filled 2015-10-08: qty 5

## 2015-10-08 MED ORDER — HYDROCODONE-ACETAMINOPHEN 5-325 MG PO TABS
1.0000 | ORAL_TABLET | Freq: Once | ORAL | Status: AC
  Administered 2015-10-08: 1 via ORAL
  Filled 2015-10-08: qty 1

## 2015-10-08 MED ORDER — CEPHALEXIN 250 MG PO CAPS
500.0000 mg | ORAL_CAPSULE | Freq: Once | ORAL | Status: AC
  Administered 2015-10-08: 500 mg via ORAL
  Filled 2015-10-08: qty 2

## 2015-10-08 MED ORDER — SULFAMETHOXAZOLE-TRIMETHOPRIM 800-160 MG PO TABS: 1.0000 | ORAL_TABLET | Freq: Two times a day (BID) | ORAL | Status: AC

## 2015-10-08 NOTE — ED Provider Notes (Signed)
CSN: [id number]     Arrival date & time 10/08/15  1802 History  By signing my name below, I, Matthew Grimes, attest that this documentation has been prepared under the direction and in the presence of [facility], PA-C. Electronically Signed: Placido Grimes, ED Scribe. 10/08/2015. 6:55 PM.     Chief Complaint  Patient presents with  . Toe Injury   The history is provided by the patient. No language interpreter was used.    HPI Comments: Matthew Grimes is a 40 y.o. male who presents to the Emergency Department complaining of constant, 6/10, left 4th toe pain x 1 day. He states that he accidentally struck the base of a table with his left foot which had a loose piece of exposed metal resulting in a sudden onset of his pain. He reports a small wound to the superior aspect of the affected toe with mild clear/bloody drainage. Also thinks he had a hangnail on his toe earlier this week that may have come off when he kicked the table. His pain worsens with palpation or when ambulating.5/10 at rest, 9/10 with ambulation.  He denies any of his current symptoms were present prior to striking the table. He is unsure of the timing of his most recent tetanus vaccination. Pt denies a PMHx of HIV and DM noting that he had both HIV and STD testing performed last month, which were negative. He denies fevers, chills, body aches and n/v.   History reviewed. No pertinent past medical history. History reviewed. No pertinent past surgical history. History reviewed. No pertinent family history. Social History  Substance Use Topics  . Smoking status: Never Smoker   . Smokeless tobacco: None  . Alcohol Use: No    Review of Systems  Constitutional: Negative for fever and chills.  Cardiovascular: Negative for leg swelling.  Gastrointestinal: Negative for nausea and vomiting.  Musculoskeletal: Negative for myalgias.  Skin: Positive for color change and wound.  Allergic/Immunologic: Negative for immunocompromised  state.  Neurological: Negative for weakness and numbness.  Hematological: Does not bruise/bleed easily.  Psychiatric/Behavioral: Negative for self-injury.    Allergies  Review of patient's allergies indicates no known allergies.  Home Medications   Prior to Admission medications   Medication Sig Start Date End Date Taking? Authorizing Provider  HYDROcodone-acetaminophen (NORCO/VICODIN) 5-325 MG per tablet Take 1 tablet by mouth every 4 (four) hours as needed for pain. Patient not taking: Reported on 12/17/2014 12/04/12   Garlon Hatchet, PA-C  naproxen (NAPROSYN) 500 MG tablet Take 1 tablet (500 mg total) by mouth 2 (two) times daily. 12/17/14   Vanetta Mulders, MD  promethazine (PHENERGAN) 25 MG tablet Take 1 tablet (25 mg total) by mouth every 6 (six) hours as needed for nausea or vomiting. 12/17/14   Vanetta Mulders, MD   BP 137/89 mmHg  Pulse 61  Temp(Src) 98.3 F (36.8 C) (Oral)  Resp 14  Ht  (1.702 m)  Wt 112 lb (50.803 kg)  BMI 17.54 kg/m2  SpO2 96%    Physical Exam  Constitutional: He appears well-developed and well-nourished. No distress.  HENT:  Head: Normocephalic and atraumatic.  Neck: Neck supple.  Cardiovascular:  Pulses:      Dorsalis pedis pulses are 2+ on the left side.  Pulmonary/Chest: Effort normal.  Neurological: He is alert. He exhibits normal muscle tone.  Skin: He is not diaphoretic. There is erythema.  Left 4th toe with erythema, edema, clear liquid drainage and TTP; erythema extends into the forefoot over the  distal second through fifth metatarsals with streaking through the midfoot to the midankle; mild fluctuance noted to the dorsal aspect of the affected toe, just proximal to the nail.    Nursing note and vitals reviewed.       ED Course  Procedures  DIAGNOSTIC STUDIES: Oxygen Saturation is 96% on RA, normal by my interpretation.    COORDINATION OF CARE: 6:40 PM Discussed next steps with pt. Pt verbalized understanding and is agreeable  with the plan.   Labs Review Labs Reviewed  CBG MONITORING, ED    Imaging Review Dg Foot Complete Left  10/08/2015  CLINICAL DATA:  Kicked table yesterday.  Fourth toe pain. EXAM: LEFT FOOT - COMPLETE 3+ VIEW COMPARISON:  None. FINDINGS: There is no evidence of fracture or dislocation. There is no evidence of arthropathy or other focal bone abnormality. Soft tissues are unremarkable. IMPRESSION: Negative. Electronically Signed   By: Paulina FusiMark  Shogry M.D.   On: 10/08/2015 19:36   I have personally reviewed and evaluated these images and lab results as part of my medical decision-making.   EKG Interpretation None       INCISION AND DRAINAGE Performed by: Trixie DredgeWEST, Elvena Oyer Consent: Verbal consent obtained. Risks and benefits: risks, benefits and alternatives were discussed Type: abscess  Body area: left 4th toe paronychia  Anesthesia: digital block 1 Incision was made with a scalpel.  Local anesthetic: lidocaine 1% no epinephrine  Anesthetic total: 3 ml  Complexity: complex Blunt dissection to break up loculations  Drainage: purulent  Drainage amount: small   Packing material: none  Patient tolerance: Patient tolerated the procedure well with no immediate complications.     MDM   Final diagnoses:  Paronychia, toe, left  Cellulitis of left foot    Afebrile, nontoxic patient with injury to his left 4th toe while accidentally kicking a table vs noticed after kicking a table.  Now with cellulitis and apparent paronychia with streaking through dorsal foot to ankle.  He is immunocompetent.   Xray negative.  I&D of presumed paronychia that may have caused the related cellulitis.  I&D performed.  Discussed home wound care and return precautions, recheck in 2 days.   D/C home with bactrim, keflex, norco - first doses given in ED.  Discussed result, findings, treatment, and follow up  with patient.  Pt given return precautions.  Pt verbalizes understanding and agrees with plan.       I personally performed the services described in this documentation, which was scribed in my presence. The recorded information has been reviewed and is accurate.   Trixie Dredgemily Rylynne Schicker, PA-C 10/08/15 2055  Mancel BaleElliott Wentz, MD 10/09/15 [phone]

## 2015-10-08 NOTE — ED Notes (Signed)
See EDP assessment 

## 2015-10-08 NOTE — ED Notes (Signed)
Pt complaining of L 4th toe drainage. Pt states struck toe on table yesterday. Some serosanguinous drainage at triage.

## 2015-10-08 NOTE — Discharge Instructions (Signed)
Read the information below.  Use the prescribed medication as directed.  Please discuss all new medications with your pharmacist.  Do not take additional tylenol while taking the prescribed pain medication to avoid overdose.  You may return to the Emergency Department at any time for worsening condition or any new symptoms that concern you.  If you develop increased redness, swelling, pus draining from the wound, worsening pain, or fevers greater than 100.4, return to the ER immediately for a recheck.    Cellulitis Cellulitis is an infection of the skin and the tissue under the skin. The infected area is usually red and tender. This happens most often in the arms and lower legs. HOME CARE   Take your antibiotic medicine as told. Finish the medicine even if you start to feel better.  Keep the infected arm or leg raised (elevated).  Put a warm cloth on the area up to 4 times per day.  Only take medicines as told by your doctor.  Keep all doctor visits as told. GET HELP IF:  You see red streaks on the skin coming from the infected area.  Your red area gets bigger or turns a dark color.  Your bone or joint under the infected area is painful after the skin heals.  Your infection comes back in the same area or different area.  You have a puffy (swollen) bump in the infected area.  You have new symptoms.  You have a fever. GET HELP RIGHT AWAY IF:   You feel very sleepy.  You throw up (vomit) or have watery poop (diarrhea).  You feel sick and have muscle aches and pains.   This information is not intended to replace advice given to you by your health care provider. Make sure you discuss any questions you have with your health care provider.   Document Released: 10/04/2007 Document Revised: 01/06/2015 Document Reviewed: 07/03/2011 Elsevier Interactive Patient Education 2016 Elsevier Inc.  Paronychia  Paronychia is an infection of the skin. It happens near a fingernail or toenail.  It may cause pain and swelling around the nail. Usually, it is not serious and it clears up with treatment. HOME CARE  Soak the fingers or toes in warm water as told by your doctor. You may be told to do this for 20 minutes, 2-3 times a day.  Keep the area dry when you are not soaking it.  Take medicines only as told by your doctor.  If you were given an antibiotic medicine, finish all of it even if you start to feel better.  Keep the affected area clean.  Do not try to drain a fluid-filled bump yourself.  Wear rubber gloves when putting your hands in water.  Wear gloves if your hands might touch cleaners or chemicals.  Follow your doctor's instructions about:  Wound care.  Bandage (dressing) changes and removal. GET HELP IF:  Your symptoms get worse or do not improve.  You have a fever or chills.  You have redness spreading from the affected area.  You have more fluid, blood, or pus coming from the affected area.  Your finger or knuckle is swollen or is hard to move.   This information is not intended to replace advice given to you by your health care provider. Make sure you discuss any questions you have with your health care provider.   Document Released: 04/05/2009 Document Revised: 09/01/2014 Document Reviewed: 03/25/2014 Elsevier Interactive Patient Education 2016 [employer].   [employer]  The [place] Ways 211 is a great source of information about community services available.  Access by dialing 2-1-1 from anywhere in [place], or by website -  PooledIncome.pl.   Other Local Resources (Updated 05/2015)  Financial Assistance   Services    Phone Number and Address  [facility]  Low-cost medical care - 1st and 3rd Saturday of every month  Must not qualify for public or private insurance and must have limited income [phone] [address] [place], [place]    [place] [employer] of  Social Services  Child care  Emergency assistance for housing and [employer]  Medicaid [phone] [address] [place], [place] [address]   [facility] Department  Low-cost medical care for children, communicable diseases, sexually-transmitted diseases, immunizations, maternity care, womens health and family planning [phone] [address] [place], [place] [address]  [facility]   Medication assistance for [facility] residents  Must meet income requirements [phone] [address] [place], [place].    [facility] Social Services  Child care  Emergency assistance for housing and [employer]  Medicaid [phone] [address] [place], [place] [address]  [facility]   Low-cost medical care,   Monday through Friday, 9 am to 6 pm.   Accepts Medicare/Medicaid, and self-pay [phone] [address] [place], [place] [address]  [facility]ena[facility] Sw[facility] for Children  Low-cost medical care - Monday through Friday, 8:30 am - 5:30 pm  Accepts Medicaid and self-pay [phone] [address] [place], [place] [address]   [employer] [facility]  Primary medical care, including for those with sickle cell disease  Accepts Medicare, Medicaid, insurance and self-pay [phone] [address] [place], [place]  [facility]   Primary medical care  Accepts Medicare, [place], insurance and self-pay [phone] [address] [place], [place] [address]   [facility] Department of Social Services  Child care  Emergency assistance for housing and [employer]  Medicaid [id number] [address] [place], [place] [address]  [facility] Department of Health and [employer]  Child care  Emergency assistance for housing and [employer]  Medicaid [phone] [address] [place], [place] [address]   [facility] Medication Assistance Program  Medication assistance for [facility] residents with no insurance only  Must have a primary care doctor [phone] [address] [place], [place]  [facility]   Primary medical care  [place], [place], insurance  [phone] [place], [place]  [facility]   Medication assistance [phone]  [facility]   Primary medical care  Accepts Medicare, [place], insurance and self-pay [phone] [address] [place], [place] [address]  [facility]   Primary medical care  Accepts Medicare, [place], insurance and self-pay [phone] [address] [place], [place] [address]  [facility]  For [place] residents between the ages of 36 and 104 who do not have any form of health insurance, Medicare, [place], or [place] benefits.  Services are provided free of charge to uninsured patients who fall within federal poverty guidelines.    Hours: Tuesdays and Thursdays, 4:15 - 8 pm [phone] [address] [place], [place] [address]  [facility]     Primary medical care  Dental care  Nutritional counseling  Pharmacy  Accepts Medicaid, Medicare, most insurance.  Fees are adjusted based on ability to pay.   [phone] [facility] [address] [place], [place]  [phone] [facility] [address] [place], [place]  [phone] [facility] [place], [place]  [phone] [facility], [address] [place], [place]  [phone] [facility] [address] [place], [place]  [facility]  Womens health and family planning [phone] [address] [place], [place]  [facility] Department of Social Services  Child care  Emergency assistance for housing and [employer]ark[employer]ilities  Food stamps  Medicaid [phone], [place], [place] [id number]   [facility]    Ages 3818 and older  Hours: Mondays and Thursdays, 7:00 am - 9:00 am Patients are seen on a first come, first served basis. [phone], ext. [phone] [place], [place]  [facility] of Social Services  Child care  Emergency assistance for housing and [employer]arkutilities  Food stamps  Medicaid [id number] [phone] [place], [place] [id number]  [employer]  Medication assistance  Rental assistance  Food pantry  Medication assistance  Housing assistance  Emergency food distribution  Utility assistance [phone] [address] [place], [place]  [phone]  [address] [place], [place] [phone] Hours: Tuesdays and Thursdays from 9am - 12 noon by appointment only  [phone] [address] [place], [place] [address]  [facility] - Lanae Boastlara F. Gunn   Accepts private insurance, [place], and [place].  Payment is based on a sliding scale for those without insurance.  Hours: Mondays, Tuesdays and Thursdays, 8:30 am - 5:30 pm.   [phone] [address] [place], [place]  [facility] - Family Medicine at [facility]    Accepts private insurance, [place], and [place].  Payment is based on a sliding scale for those without insurance. [phone] [address] [place], [place]  [facility] - Pediatrics at [address]. Scientist, research (physical sciences)[place]  Accepts private insurance, [employer] EntertainmentMedicare, and [place]icaid.  Payment is based on a sliding scale for those without insurance [phone] [address], [place], [place]  [facility] - [facility]s at [place]  Accepts private insurance, [place]icare, and [place].  Payment is based on a sliding scale for those  without insurance. [phone] [address] [place], [place]  [facility] - Pediatrics at [facility]  Accepts private insurance, [place], and [place].  Payment is based on a sliding scale for those without insurance. [phone], [phone] [place], [place].    [facility]  Maternity care.  Accepts Medicaid and self-pay. [phone] [address] [place], [place]

## 2015-10-08 NOTE — ED Notes (Signed)
Pt foot soaking in warm water, I&D tray at bedside

## 2015-10-08 NOTE — ED Notes (Signed)
Acuity 3 per PA Irving BurtonEmily

## 2016-09-11 ENCOUNTER — Emergency Department (HOSPITAL_COMMUNITY)
Admission: EM | Admit: 2016-09-11 | Discharge: 2016-09-11 | Disposition: A | Discharge status: Home / Self Care | Payer: Medicaid Other | Attending: Emergency Medicine | Admitting: Emergency Medicine

## 2016-09-11 ENCOUNTER — Encounter (HOSPITAL_COMMUNITY): Payer: Self-pay | Admitting: Emergency Medicine

## 2016-09-11 DIAGNOSIS — N3091 Cystitis, unspecified with hematuria: Secondary | ICD-10-CM | POA: Diagnosis not present

## 2016-09-11 DIAGNOSIS — N390 Urinary tract infection, site not specified: Secondary | ICD-10-CM

## 2016-09-11 DIAGNOSIS — R319 Hematuria, unspecified: Secondary | ICD-10-CM | POA: Diagnosis present

## 2016-09-11 LAB — BASIC METABOLIC PANEL
ANION GAP: 7 (ref 5–15)
BUN: 11 mg/dL (ref 6–20)
CALCIUM: 9.2 mg/dL (ref 8.9–10.3)
CO2: 22 mmol/L (ref 22–32)
Chloride: 108 mmol/L (ref 101–111)
Creatinine, Ser: 0.86 mg/dL (ref 0.61–1.24)
GFR calc Af Amer: >60 mL/min (ref >60)
GLUCOSE: 97 mg/dL (ref 65–99)
Potassium: 3.6 mmol/L (ref 3.5–5.1)
SODIUM: 137 mmol/L (ref 135–145)

## 2016-09-11 LAB — URINALYSIS, ROUTINE W REFLEX MICROSCOPIC
Bilirubin Urine: NEGATIVE
GLUCOSE, UA: NEGATIVE mg/dL
KETONES UR: NEGATIVE mg/dL
NITRITE: NEGATIVE
PROTEIN: 30 mg/dL — AB
SQUAMOUS EPITHELIAL / LPF: NONE SEEN
Specific Gravity, Urine: 1.02 (ref 1.005–1.030)
pH: 5 (ref 5.0–8.0)

## 2016-09-11 LAB — CBC
HCT: 39.5 % (ref 39.0–52.0)
Hemoglobin: 13.6 g/dL (ref 13.0–17.0)
MCH: 30.8 pg (ref 26.0–34.0)
MCHC: 34.4 g/dL (ref 30.0–36.0)
MCV: 89.6 fL (ref 78.0–100.0)
Platelets: 240 10*3/uL (ref 150–400)
RBC: 4.41 MIL/uL (ref 4.22–5.81)
RDW: 15 % (ref 11.5–15.5)
WBC: 8.6 10*3/uL (ref 4.0–10.5)

## 2016-09-11 MED ORDER — CEFTRIAXONE SODIUM 250 MG IJ SOLR
250.0000 mg | Freq: Once | INTRAMUSCULAR | Status: AC
  Administered 2016-09-11: 250 mg via INTRAMUSCULAR
  Filled 2016-09-11: qty 250

## 2016-09-11 MED ORDER — AZITHROMYCIN 250 MG PO TABS
1000.0000 mg | ORAL_TABLET | Freq: Once | ORAL | Status: AC
  Administered 2016-09-11: 1000 mg via ORAL
  Filled 2016-09-11: qty 4

## 2016-09-11 MED ORDER — DEXTROSE 5 % IV SOLN: 1.0000 g | Freq: Once | INTRAVENOUS | Status: DC

## 2016-09-11 MED ORDER — CEPHALEXIN 500 MG PO CAPS: 500.0000 mg | ORAL_CAPSULE | Freq: Four times a day (QID) | ORAL | 0 refills | Status: DC

## 2016-09-11 NOTE — ED Triage Notes (Signed)
Pt reports urinary frequency, hematuria and  Lower abdominal pain that began this am.

## 2016-09-11 NOTE — ED Provider Notes (Signed)
[facility] Provider Note   CSN: [id number] Arrival date & time: 09/11/16  1308     History   Chief Complaint Chief Complaint  Patient presents with  . Hematuria    HPI Matthew Grimes is a 41 y.o. male.  Patient c/o dysuria, hematuria for the past couple days. Remote hx single prior uti. Denies penile discharge. No abd or flank pain. No nausea or vomiting. No scrotal or testicular pain. Denies trauma to abdomen or flank. No recent fall or injury. No anticoag use. No other abnormal bruising or bleeding. Symptoms persistent since onset.    The history is provided by the patient.  Hematuria  Pertinent negatives include no abdominal pain and no headaches.    History reviewed. No pertinent past medical history.  There are no active problems to display for this patient.   History reviewed. No pertinent surgical history.     Home Medications    Prior to Admission medications   Medication Sig Start Date End Date Taking? Authorizing Provider  cephALEXin (KEFLEX) 500 MG capsule Take 1 capsule (500 mg total) by mouth 4 (four) times daily. 10/08/15   Trixie Dredge, PA-C  HYDROcodone-acetaminophen (NORCO/VICODIN) 5-325 MG tablet Take 1-2 tablets by mouth every 4 (four) hours as needed for moderate pain or severe pain. 10/08/15   Trixie Dredge, PA-C  naproxen (NAPROSYN) 500 MG tablet Take 1 tablet (500 mg total) by mouth 2 (two) times daily. 12/17/14   Vanetta Mulders, MD  promethazine (PHENERGAN) 25 MG tablet Take 1 tablet (25 mg total) by mouth every 6 (six) hours as needed for nausea or vomiting. 12/17/14   Vanetta Mulders, MD    Family History No family history on file.  Social History Social History  Substance Use Topics  . Smoking status: Never Smoker  . Smokeless tobacco: Not on file  . Alcohol use No     Allergies   Patient has no known allergies.   Review of Systems Review of Systems  Constitutional: Negative for fever.  HENT: Negative for sore throat.     Gastrointestinal: Negative for abdominal pain and vomiting.  Genitourinary: Positive for hematuria. Negative for discharge and flank pain.  Musculoskeletal: Negative for back pain.  Skin: Negative for rash.  Neurological: Negative for headaches.  Hematological: Does not bruise/bleed easily.     Physical Exam Updated Vital Signs BP 134/81 (BP Location: Right Arm)   Pulse (!) 48   Temp 98.2 F (36.8 C) (Oral)   Resp 17   SpO2 100%   Physical Exam  Constitutional: He appears well-developed and well-nourished. No distress.  HENT:  Mouth/Throat: Oropharynx is clear and moist.  Eyes: Conjunctivae are normal.  Neck: Neck supple. No tracheal deviation present.  Cardiovascular: Normal rate.   Pulmonary/Chest: Effort normal. No accessory muscle usage. No respiratory distress.  Abdominal: Soft. Bowel sounds are normal. He exhibits no distension and no mass. There is no tenderness. There is no rebound and no guarding. No hernia.  Genitourinary:  Genitourinary Comments: Normal external gu exam. No scrotal or testicular pain, swelling or tenderness. No d/c. No cva tenderness  Musculoskeletal: He exhibits no edema.  Neurological: He is alert.  Skin: Skin is warm and dry. No rash noted. He is not diaphoretic.  Psychiatric: He has a normal mood and affect.  Nursing note and vitals reviewed.    ED Treatments / Results  Labs (all labs ordered are listed, but only abnormal results are displayed) Results for orders placed or performed during the hospital  encounter of 09/11/16  Urinalysis, Routine w reflex microscopic- may I&O cath if menses  Result Value Ref Range   Color, Urine AMBER (A) YELLOW   APPearance CLOUDY (A) CLEAR   Specific Gravity, Urine 1.020 1.005 - 1.030   pH 5.0 5.0 - 8.0   Glucose, UA NEGATIVE NEGATIVE mg/dL   Hgb urine dipstick LARGE (A) NEGATIVE   Bilirubin Urine NEGATIVE NEGATIVE   Ketones, ur NEGATIVE NEGATIVE mg/dL   Protein, ur 30 (A) NEGATIVE mg/dL   Nitrite  NEGATIVE NEGATIVE   Leukocytes, UA LARGE (A) NEGATIVE   RBC / HPF TOO NUMEROUS TO COUNT 0 - 5 RBC/hpf   WBC, UA TOO NUMEROUS TO COUNT 0 - 5 WBC/hpf   Bacteria, UA FEW (A) NONE SEEN   Squamous Epithelial / LPF NONE SEEN NONE SEEN   WBC Clumps PRESENT    Mucous PRESENT   Basic metabolic panel  Result Value Ref Range   Sodium 137 135 - 145 mmol/L   Potassium 3.6 3.5 - 5.1 mmol/L   Chloride 108 101 - 111 mmol/L   CO2 22 22 - 32 mmol/L   Glucose, Bld 97 65 - 99 mg/dL   BUN 11 6 - 20 mg/dL   Creatinine, Ser 7.410.86 0.61 - 1.24 mg/dL   Calcium 9.2 8.9 - 28.710.3 mg/dL   GFR calc non Af Amer >60 >60 mL/min   GFR calc Af Amer >60 >60 mL/min   Anion gap 7 5 - 15  CBC  Result Value Ref Range   WBC 8.6 4.0 - 10.5 K/uL   RBC 4.41 4.22 - 5.81 MIL/uL   Hemoglobin 13.6 13.0 - 17.0 g/dL   HCT 86.739.5 67.239.0 - 09.452.0 %   MCV 89.6 78.0 - 100.0 fL   MCH 30.8 26.0 - 34.0 pg   MCHC 34.4 30.0 - 36.0 g/dL   RDW 70.915.0 62.811.5 - 36.615.5 %   Platelets 240 150 - 400 K/uL    EKG  EKG Interpretation None       Radiology No results found.  Procedures Procedures (including critical care time)  Medications Ordered in ED Medications  cefTRIAXone (ROCEPHIN) injection 250 mg (not administered)  azithromycin (ZITHROMAX) tablet 1,000 mg (not administered)     Initial Impression / Assessment and Plan / ED Course  I have reviewed the triage vital signs and the nursing notes.  Pertinent labs & imaging results that were available during my care of the patient were reviewed by me and considered in my medical decision making (see chart for details).  Confirmed nkda.   Rocephin im. zithromax po.  rx for home.  abd soft nt. Tolerating po fluids.  Patient appears stable for d/c.    Final Clinical Impressions(s) / ED Diagnoses   Final diagnoses:  None    New Prescriptions New Prescriptions   No medications on file     Cathren LaineSteinl, Camila [place], MD 09/11/16 1255

## 2016-09-11 NOTE — Discharge Instructions (Signed)
It was our pleasure to provide your ER care today - we hope that you feel better.  Rest. Drink plenty of fluids.  Take antibiotic as prescribed.  Follow up with urologist in the next 2-3 weeks - see referral - call office to arrange appointment.   Return to ER if worse, new symptoms, severe abdominal pain, persistent vomiting, unable to void, other concern.

## 2016-09-13 LAB — URINE CULTURE: Culture: 80000 — AB

## 2016-09-14 ENCOUNTER — Telehealth: Payer: Self-pay | Admitting: *Deleted

## 2016-09-14 NOTE — Telephone Encounter (Signed)
Post ED Visit - Positive Culture Follow-up  Culture report reviewed by antimicrobial stewardship pharmacist:  []  Enzo BiNathan Batchelder, Pharm.D. [x]  Celedonio MiyamotoJeremy Frens, Pharm.D., BCPS AQ-ID []  Garvin FilaMike Maccia, Pharm.D., BCPS []  Georgina PillionElizabeth Martin, Pharm.D., BCPS []  AdrianMinh Pham, [address] BoulevardPharm.D., BCPS, AAHIVP []  Estella HuskMichelle Turner, Pharm.D., BCPS, AAHIVP []  Lysle Pearlachel Rumbarger, PharmD, BCPS []  Casilda Carlsaylor Stone, PharmD, BCPS []  Pollyann SamplesAndy Johnston, PharmD, BCPS  Positive urine culture Treated with Cephalexin, organism sensitive to the same and no further patient follow-up is required at this time.  Virl AxeRobertson, Jerricka Carvey [facility] 09/14/2016, 11:34 AM

## 2017-09-25 ENCOUNTER — Emergency Department (HOSPITAL_COMMUNITY): Payer: Medicaid Other

## 2017-09-25 ENCOUNTER — Emergency Department (HOSPITAL_COMMUNITY)
Admission: EM | Admit: 2017-09-25 | Discharge: 2017-09-25 | Disposition: A | Discharge status: Home / Self Care | Payer: Medicaid Other | Attending: Emergency Medicine | Admitting: Emergency Medicine

## 2017-09-25 ENCOUNTER — Encounter (HOSPITAL_COMMUNITY): Payer: Self-pay | Admitting: Emergency Medicine

## 2017-09-25 DIAGNOSIS — M5416 Radiculopathy, lumbar region: Secondary | ICD-10-CM | POA: Diagnosis not present

## 2017-09-25 DIAGNOSIS — Z79899 Other long term (current) drug therapy: Secondary | ICD-10-CM | POA: Diagnosis not present

## 2017-09-25 DIAGNOSIS — M545 Low back pain: Secondary | ICD-10-CM | POA: Diagnosis present

## 2017-09-25 MED ORDER — PREDNISONE 10 MG (21) PO TBPK: ORAL_TABLET | Freq: Every day | ORAL | 0 refills | Status: DC

## 2017-09-25 MED ORDER — HYDROCODONE-ACETAMINOPHEN 5-325 MG PO TABS: 1.0000 | ORAL_TABLET | Freq: Four times a day (QID) | ORAL | 0 refills | Status: DC | PRN

## 2017-09-25 MED ORDER — METHOCARBAMOL 500 MG PO TABS
500.0000 mg | ORAL_TABLET | Freq: Two times a day (BID) | ORAL | 0 refills | Status: DC
Start: 2017-09-25 — End: 2020-03-09

## 2017-09-25 MED ORDER — HYDROCODONE-ACETAMINOPHEN 5-325 MG PO TABS
1.0000 | ORAL_TABLET | Freq: Once | ORAL | Status: AC
  Administered 2017-09-25: 1 via ORAL
  Filled 2017-09-25: qty 1

## 2017-09-25 NOTE — Discharge Instructions (Signed)
Thank you for allowing me to care for you today in the emergency department.  You can take 600 mg of ibuprofen with food or 650 mg of Tylenol every 6 hours for pain control.  For severe pain, he can take 1 tablet of Vicodin every 6 hours as needed.  Please do not work or drive while taking this medication because it is a narcotic and can cause you to be impaired.  Apply ice for 15 to 20 minutes up to 3-4 times per day.  Start to stretch as your pain allows.  Prednisone is an anti-inflammatory.  Take it as follows: take 6 tabs by mouth daily  for 2 days, then 5 tabs for 2 days, then 4 tabs for 2 days, then 3 tabs for 2 days, 2 tabs for 2 days, then 1 tab by mouth daily for 2 days.  Also take 1 tablet of Robaxin every 12 hours as needed for muscle pain and spasms.  If your pain does not start to improve or the next week, please follow-up with your primary care provider.  If you develop new or worsening symptoms including another fall or injury, if you pee or poop on yourself, develop weakness, numbness, or high fever, return to the emergency department for reevaluation.

## 2017-09-25 NOTE — ED Provider Notes (Signed)
[facility] EMERGENCY DEPARTMENT Provider Note   CSN: [id number] Arrival date & time: 09/25/17  4098     History   Chief Complaint Chief Complaint  Patient presents with  . Back Pain    HPI Matthew Grimes is a 42 y.o. male with no significant past medical history who presents to the emergency department with a chief complaint of back pain.  The patient endorses midline low back pain that began approximately 1 hour last night after he bent over to pick up his god daughter.  The pain is gradually worsened since onset.  His pain is at a 2-3 while he was sitting, but increases to a 10 with standing or walking.  He states that this morning he was in so much pain that he required his significant other's assistance to get out of bed and to walk.  No previous injury or surgery to his back.  He denies weakness, numbness, fever, chills, urinary fecal incontinence, dysuria, hematuria, or penile or testicular pain or swelling.  No treatment prior to arrival.  The history is provided by the patient. No language interpreter was used.  Back Pain   Pertinent negatives include no chest pain, no fever, no numbness, no headaches, no abdominal pain, no dysuria and no weakness.    History reviewed. No pertinent past medical history.  There are no active problems to display for this patient.   History reviewed. No pertinent surgical history.      Home Medications    Prior to Admission medications   Medication Sig Start Date End Date Taking? Authorizing Provider  cephALEXin (KEFLEX) 500 MG capsule Take 1 capsule (500 mg total) by mouth 4 (four) times daily. 10/08/15   Trixie Dredge, PA-C  cephALEXin (KEFLEX) 500 MG capsule Take 1 capsule (500 mg total) by mouth 4 (four) times daily. 09/11/16   Cathren Laine, MD  HYDROcodone-acetaminophen (NORCO/VICODIN) 5-325 MG tablet Take 1 tablet by mouth every 6 (six) hours as needed. 09/25/17   McDonald, Mia A, PA-C  methocarbamol (ROBAXIN)  500 MG tablet Take 1 tablet (500 mg total) by mouth 2 (two) times daily. 09/25/17   McDonald, Mia A, PA-C  naproxen (NAPROSYN) 500 MG tablet Take 1 tablet (500 mg total) by mouth 2 (two) times daily. 12/17/14   Vanetta Mulders, MD  predniSONE (STERAPRED UNI-PAK 21 TAB) 10 MG (21) TBPK tablet Take by mouth daily. Take 6 tabs by mouth daily  for 2 days, then 5 tabs for 2 days, then 4 tabs for 2 days, then 3 tabs for 2 days, 2 tabs for 2 days, then 1 tab by mouth daily for 2 days 09/25/17   McDonald, Mia A, PA-C  promethazine (PHENERGAN) 25 MG tablet Take 1 tablet (25 mg total) by mouth every 6 (six) hours as needed for nausea or vomiting. 12/17/14   Vanetta Mulders, MD    Family History No family history on file.  Social History Social History   Tobacco Use  . Smoking status: Never Smoker  Substance Use Topics  . Alcohol use: No  . Drug use: Not on file     Allergies   Patient has no known allergies.   Review of Systems Review of Systems  Constitutional: Negative for fatigue and fever.  Respiratory: Negative for chest tightness and shortness of breath.   Cardiovascular: Negative for chest pain.  Gastrointestinal: Negative for abdominal pain, diarrhea, nausea and vomiting.  Genitourinary: Negative for dysuria, frequency, hematuria and urgency.  Musculoskeletal: Positive for arthralgias,  back pain, gait problem ( 2/2 pain) and myalgias. Negative for joint swelling, neck pain and neck stiffness.  Skin: Negative for rash.  Allergic/Immunologic: Negative for immunocompromised state.  Neurological: Negative for weakness, light-headedness, numbness and headaches.  All other systems reviewed and are negative.  Physical Exam Updated Vital Signs BP 126/73 (BP Location: Right Arm)   Pulse 60   Temp 98 F (36.7 C) (Oral)   Resp 16   SpO2 100%   Physical Exam  Constitutional: He appears well-developed.  HENT:  Head: Normocephalic.  Eyes: Conjunctivae are normal.  Neck: Neck supple.    Cardiovascular: Normal rate and regular rhythm.  No murmur heard. Pulmonary/Chest: Effort normal.  Abdominal: Soft. He exhibits no distension.  Musculoskeletal: Normal range of motion. He exhibits tenderness. He exhibits no edema or deformity.  Tender to palpation over the spinous processes of the lumbar and sacral spine.  Cervical and thoracic spinous processes are nontender.  Bilateral paraspinal cervical muscles of the cervical, thoracic, lumbar, sacral spine are nontender to palpation.  No crepitus, step-offs, or deformities.  There is no overlying erythema, edema, warmth, or ecchymosis.  5 out of 5 strength against resistance with dorsiflexion plantarflexion.  DP PT pulses are 2+ and symmetric.  Sensation is intact and symmetric throughout the bilateral lower extremities.  Antalgic gait.  The patient appears very stiff and somewhat bent forward as he attempts to walk.  He is able to take a few steps, but then requires my assistance to get back to the chair and sit.  Neurological: He is alert.  Skin: Skin is warm and dry.  Psychiatric: His behavior is normal.  Nursing note and vitals reviewed.    ED Treatments / Results  Labs (all labs ordered are listed, but only abnormal results are displayed) Labs Reviewed - No data to display  EKG None  Radiology Ct Lumbar Spine Wo Contrast  Result Date: 09/25/2017 CLINICAL DATA:  Low back pain since picking up an infant yesterday evening. EXAM: CT LUMBAR SPINE WITHOUT CONTRAST TECHNIQUE: Multidetector CT imaging of the lumbar spine was performed without intravenous contrast administration. Multiplanar CT image reconstructions were also generated. COMPARISON:  None. FINDINGS: Segmentation: Standard. Alignment: Maintained. Vertebrae: No fracture or worrisome lesion. No pars interarticularis defect. Paraspinal and other soft tissues: Negative. Disc levels: Disc height is maintained at all levels. Tiny Schmorl's node in the superior endplate of L5  is noted. Very shallow disc bulge is seen at L5-S1. Intervertebral disc spaces are otherwise unremarkable. The central canal and foramina appear open at all levels. IMPRESSION: No acute abnormality. Mild degenerative disc disease L5-S1 without central canal or foraminal narrowing. Electronically Signed   By: Drusilla Kanner M.D.   On: 09/25/2017 14:43    Procedures Procedures (including critical care time)  Medications Ordered in ED Medications  HYDROcodone-acetaminophen (NORCO/VICODIN) 5-325 MG per tablet 1 tablet (1 tablet Oral Given 09/25/17 1321)     Initial Impression / Assessment and Plan / ED Course  I have reviewed the triage vital signs and the nursing notes.  Pertinent labs & imaging results that were available during my care of the patient were reviewed by me and considered in my medical decision making (see chart for details).     42 year old male with no significant past medical history presenting with back pain began last night.  No red flags on neurologic exam.  The patient does have an antalgic gait and appears quite uncomfortable and required assistance with walking.  Vicodin given for pain control  in the ED.   No urinary or bowel incontinence.  No concern for cauda equina, infection, AAA, infection, or fracture.  No constitutional symptoms including fever, chills, or weight loss,  No h/o cancer, IVDU.On reevaluation, the patient reports his pain has significantly improved and he was independently ambulatory.  Gait continues to be antalgic, but much improved.  CT abdomen pelvis with mild degenerative disc disease of L5-S1 without central canal or foraminal narrowing.  Will discharge the patient to home with pain medication and supportive care. A 62-month prescription history query was performed using the [place] CSRS prior to discharge. Strict return precautions given.  The patient is hemodynamically stable and in no acute distress.  He is safe for discharge home with outpatient  follow-up with his PCP at this time.  Final Clinical Impressions(s) / ED Diagnoses   Final diagnoses:  Lumbar radiculopathy    ED Discharge Orders        Ordered    HYDROcodone-acetaminophen (NORCO/VICODIN) 5-325 MG tablet  Every 6 hours PRN     09/25/17 1537    predniSONE (STERAPRED UNI-PAK 21 TAB) 10 MG (21) TBPK tablet  Daily     09/25/17 1537    methocarbamol (ROBAXIN) 500 MG tablet  2 times daily     09/25/17 1537       McDonald, Mia A, PA-C 09/25/17 1546    Tilden Fossa, MD 09/26/17 [phone]

## 2017-09-25 NOTE — ED Notes (Signed)
ED Provider at bedside. 

## 2017-09-25 NOTE — ED Triage Notes (Signed)
Pt reports lower back pain right above tail bone that started at 7pm last night, pt reports he had just picked up his god daughter into her carrier but other than that has done nothing out of the ordinary.

## 2017-09-26 ENCOUNTER — Telehealth: Payer: Self-pay | Admitting: *Deleted

## 2017-09-26 NOTE — Telephone Encounter (Signed)
Called for pt status.

## 2018-03-24 ENCOUNTER — Emergency Department (HOSPITAL_COMMUNITY)
Admission: EM | Admit: 2018-03-24 | Discharge: 2018-03-24 | Disposition: A | Discharge status: Home / Self Care | Payer: Medicaid Other | Attending: Emergency Medicine | Admitting: Emergency Medicine

## 2018-03-24 ENCOUNTER — Other Ambulatory Visit: Payer: Self-pay

## 2018-03-24 ENCOUNTER — Encounter (HOSPITAL_COMMUNITY): Payer: Self-pay | Admitting: Emergency Medicine

## 2018-03-24 DIAGNOSIS — N390 Urinary tract infection, site not specified: Secondary | ICD-10-CM | POA: Diagnosis not present

## 2018-03-24 DIAGNOSIS — R319 Hematuria, unspecified: Secondary | ICD-10-CM | POA: Diagnosis present

## 2018-03-24 DIAGNOSIS — R1030 Lower abdominal pain, unspecified: Secondary | ICD-10-CM | POA: Diagnosis not present

## 2018-03-24 LAB — URINALYSIS, ROUTINE W REFLEX MICROSCOPIC
Bilirubin Urine: NEGATIVE
Glucose, UA: NEGATIVE mg/dL
Ketones, ur: NEGATIVE mg/dL
NITRITE: NEGATIVE
Protein, ur: 30 mg/dL — AB
RBC / HPF: >50 RBC/hpf — ABNORMAL HIGH (ref 0–5)
SPECIFIC GRAVITY, URINE: 1.027 (ref 1.005–1.030)
WBC, UA: >50 WBC/hpf — ABNORMAL HIGH (ref 0–5)
pH: 5 (ref 5.0–8.0)

## 2018-03-24 MED ORDER — CEPHALEXIN 500 MG PO CAPS: 500.0000 mg | ORAL_CAPSULE | Freq: Four times a day (QID) | ORAL | 0 refills | Status: AC

## 2018-03-24 MED ORDER — CEPHALEXIN 250 MG PO CAPS
500.0000 mg | ORAL_CAPSULE | Freq: Once | ORAL | Status: AC
  Administered 2018-03-24: 500 mg via ORAL
  Filled 2018-03-24: qty 2

## 2018-03-24 NOTE — ED Provider Notes (Signed)
[facility] EMERGENCY DEPARTMENT Provider Note  CSN: [id number] Arrival date & time: 03/24/18 4098  Chief Complaint(s) Hematuria and Flank Pain  HPI Matthew Grimes is a 42 y.o. male   The history is provided by the patient.  Hematuria  This is a recurrent problem. The current episode started 3 to 5 hours ago. Episode frequency: once. Associated symptoms include abdominal pain (suprapubic soreness only with voiding.). Pertinent negatives include no chest pain, no headaches and no shortness of breath. Nothing aggravates the symptoms. Nothing relieves the symptoms. He has tried nothing for the symptoms.    Past Medical History History reviewed. No pertinent past medical history. There are no active problems to display for this patient.  Home Medication(s) Prior to Admission medications   Medication Sig Start Date End Date Taking? Authorizing Provider  cephALEXin (KEFLEX) 500 MG capsule Take 1 capsule (500 mg total) by mouth 4 (four) times daily for 7 days. 03/24/18 03/31/18  Nira Conn, MD  HYDROcodone-acetaminophen (NORCO/VICODIN) 5-325 MG tablet Take 1 tablet by mouth every 6 (six) hours as needed. Patient not taking: Reported on 03/24/2018 09/25/17   McDonald,  Earls A, PA-C  methocarbamol (ROBAXIN) 500 MG tablet Take 1 tablet (500 mg total) by mouth 2 (two) times daily. Patient not taking: Reported on 03/24/2018 09/25/17   McDonald, Mia A, PA-C  naproxen (NAPROSYN) 500 MG tablet Take 1 tablet (500 mg total) by mouth 2 (two) times daily. Patient not taking: Reported on 03/24/2018 12/17/14   Vanetta Mulders, MD  predniSONE (STERAPRED UNI-PAK 21 TAB) 10 MG (21) TBPK tablet Take by mouth daily. Take 6 tabs by mouth daily  for 2 days, then 5 tabs for 2 days, then 4 tabs for 2 days, then 3 tabs for 2 days, 2 tabs for 2 days, then 1 tab by mouth daily for 2 days Patient not taking: Reported on 03/24/2018 09/25/17   McDonald, Mia A, PA-C  promethazine (PHENERGAN)  25 MG tablet Take 1 tablet (25 mg total) by mouth every 6 (six) hours as needed for nausea or vomiting. Patient not taking: Reported on 03/24/2018 12/17/14   Vanetta Mulders, MD                                                                                                                                    Past Surgical History History reviewed. No pertinent surgical history. Family History No family history on file.  Social History Social History   Tobacco Use  . Smoking status: Never Smoker  . Smokeless tobacco: Never Used  Substance Use Topics  . Alcohol use: No  . Drug use: Never   Allergies Patient has no known allergies.  Review of Systems Review of Systems  Respiratory: Negative for shortness of breath.   Cardiovascular: Negative for chest pain.  Gastrointestinal: Positive for abdominal pain (suprapubic soreness only with voiding.).  Genitourinary: Positive for hematuria.  Neurological: Negative for headaches.  All other systems are reviewed and are negative for acute change except as noted in the HPI  Physical Exam Vital Signs  I have reviewed the triage vital signs BP (!) 100/59   Pulse (!) 54   Ht 5\' 7"  (1.702 m)   Wt 52.2 kg   SpO2 96%   BMI 18.01 kg/m   Physical Exam  Constitutional: He is oriented to person, place, and time. He appears well-developed and well-nourished. No distress.  HENT:  Head: Normocephalic and atraumatic.  Right Ear: External ear normal.  Left Ear: External ear normal.  Nose: Nose normal.  Mouth/Throat: Mucous membranes are normal. No trismus in the jaw.  Eyes: Conjunctivae and EOM are normal. No scleral icterus.  Neck: Normal range of motion and phonation normal.  Cardiovascular: Normal rate and regular rhythm.  Pulmonary/Chest: Effort normal. No stridor. No respiratory distress.  Abdominal: He exhibits no distension. Hernia confirmed negative in the right inguinal area and confirmed negative in the left inguinal area.    Genitourinary: Testes normal and penis normal. Right testis shows no swelling and no tenderness. Left testis shows no swelling and no tenderness. Circumcised. No penile erythema or penile tenderness. No discharge found.  Musculoskeletal: Normal range of motion. He exhibits no edema.  Lymphadenopathy: No inguinal adenopathy noted on the right or left side.  Neurological: He is alert and oriented to person, place, and time.  Skin: He is not diaphoretic.  Psychiatric: He has a normal mood and affect. His behavior is normal.  Vitals reviewed.   ED Results and Treatments Labs (all labs ordered are listed, but only abnormal results are displayed) Labs Reviewed  URINALYSIS, ROUTINE W REFLEX MICROSCOPIC - Abnormal; Notable for the following components:      Result Value   APPearance CLOUDY (*)    Hgb urine dipstick LARGE (*)    Protein, ur 30 (*)    Leukocytes, UA LARGE (*)    RBC / HPF >50 (*)    WBC, UA >50 (*)    Bacteria, UA MANY (*)    All other components within normal limits  URINE CULTURE                                                                                                                         EKG  EKG Interpretation  Date/Time:    Ventricular Rate:    PR Interval:    QRS Duration:   QT Interval:    QTC Calculation:   R Axis:     Text Interpretation:        Radiology No results found. Pertinent labs & imaging results that were available during my care of the patient were reviewed by me and considered in my medical decision making (see chart for details).  Medications Ordered in ED Medications  cephALEXin (KEFLEX) capsule 500 mg (has no administration in time range)  Procedures Procedures  (including critical care time)  Medical Decision Making / ED Course I have reviewed the nursing notes for this encounter and the  patient's prior records (if available in EHR or on provided paperwork).    UA consistent with urinary tract infection.  On review of records patient previously grew out E. coli that was resistant to ampicillin.  Susceptible to all other antibiotics.  Given first dose of Keflex in the emergency department.  The patient appears reasonably screened and/or stabilized for discharge and I doubt any other medical condition or other [facility] requiring further screening, evaluation, or treatment in the ED at this time prior to discharge.  The patient is safe for discharge with strict return precautions.   Final Clinical Impression(s) / ED Diagnoses Final diagnoses:  Lower urinary tract infectious disease    Disposition: Discharge  Condition: Good  I have discussed the results, Dx and Tx plan with the patient who expressed understanding and agree(s) with the plan. Discharge instructions discussed at great length. The patient was given strict return precautions who verbalized understanding of the instructions. No further questions at time of discharge.    ED Discharge Orders         Ordered    cephALEXin (KEFLEX) 500 MG capsule  4 times daily     03/24/18 [id number]           Follow Up: Primary care provider  Schedule an appointment as soon as possible for a visit  If you do not have a primary care physician, contact [facility] at [phone] for referral     This chart was dictated using voice recognition software.  Despite best efforts to proofread,  errors can occur which can change the documentation meaning.   Nira Connardama,  Eduardo, MD 03/24/18 [phone]

## 2018-03-24 NOTE — ED Triage Notes (Signed)
Pt in from home with c/o hematuria and burning with urination since last night. States he has had similar symptoms with a kidney infection last year. Endorses slight flank tenderness, denies fevers. Reports he drank two 2L Cokes over the past few days and the same caused last kidney infection

## 2018-03-26 LAB — URINE CULTURE: Culture: >=100000 — AB

## 2019-03-15 ENCOUNTER — Emergency Department (HOSPITAL_COMMUNITY)
Admission: EM | Admit: 2019-03-15 | Discharge: 2019-03-15 | Disposition: A | Discharge status: Home / Self Care | Payer: Medicaid Other | Attending: Emergency Medicine | Admitting: Emergency Medicine

## 2019-03-15 ENCOUNTER — Encounter (HOSPITAL_COMMUNITY): Payer: Self-pay

## 2019-03-15 ENCOUNTER — Emergency Department (HOSPITAL_COMMUNITY): Payer: Medicaid Other

## 2019-03-15 ENCOUNTER — Other Ambulatory Visit: Payer: Self-pay

## 2019-03-15 DIAGNOSIS — Y92410 Unspecified street and highway as the place of occurrence of the external cause: Secondary | ICD-10-CM | POA: Insufficient documentation

## 2019-03-15 DIAGNOSIS — Y9389 Activity, other specified: Secondary | ICD-10-CM | POA: Insufficient documentation

## 2019-03-15 DIAGNOSIS — S99912A Unspecified injury of left ankle, initial encounter: Secondary | ICD-10-CM | POA: Diagnosis present

## 2019-03-15 DIAGNOSIS — S93402A Sprain of unspecified ligament of left ankle, initial encounter: Secondary | ICD-10-CM | POA: Diagnosis not present

## 2019-03-15 DIAGNOSIS — Y999 Unspecified external cause status: Secondary | ICD-10-CM | POA: Diagnosis not present

## 2019-03-15 MED ORDER — IBUPROFEN 800 MG PO TABS: 800.0000 mg | ORAL_TABLET | Freq: Three times a day (TID) | ORAL | 0 refills | Status: DC
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
## 2019-03-15 NOTE — Progress Notes (Signed)
Orthopedic Tech Progress Note Patient Details:  Matthew Grimes 05/11/1975 [id number]  Ortho Devices Type of Ortho Device: ASO, Crutches Ortho Device/Splint Location: left Ortho Device/Splint Interventions: Application   Post Interventions Patient Tolerated: Well Instructions Provided: Care of device   [name] 03/15/2019, 12:49 PM

## 2019-03-15 NOTE — ED Notes (Signed)
Patient verbalizes understanding of discharge instructions. Opportunity for questioning and answers were provided. Armband removed by staff, pt discharged from ED.  

## 2019-03-15 NOTE — Discharge Instructions (Addendum)
Your x-ray did not show any signs of broken bones in the ankle.  You most likely have sprained your ankle.  Follow-up with the orthopedist as needed.  Ice and elevate the ankle.
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
## 2019-09-09 IMAGING — CT CT L SPINE W/O CM
3 series · 10 of 33 positions shown, 12 images · non-contrast
Comparison: None.

CLINICAL DATA: Low back pain since picking up an infant yesterday
evening.

EXAM:
CT LUMBAR SPINE WITHOUT CONTRAST
TECHNIQUE: Multidetector CT imaging of the lumbar spine was performed without
intravenous contrast administration. Multiplanar CT image
reconstructions were also generated.

[Series 5: l spine 2.0 st · axial · 0.27mm/px · z∈[+964,+1080]mm · 2 of 128 slices shown, 3 images]
[im 40/128  soft-tissue]
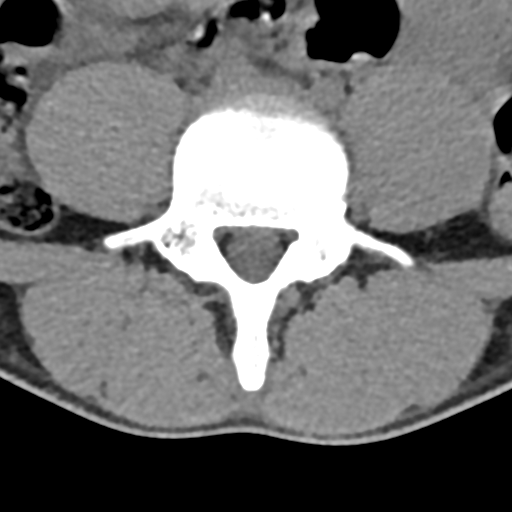
[im 40/128  bone]
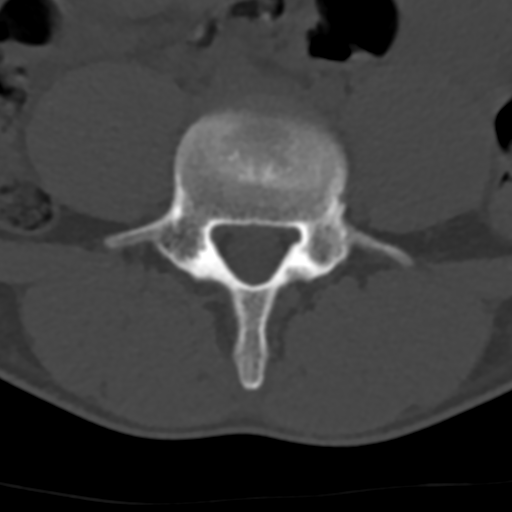
[im 98/128  bone]
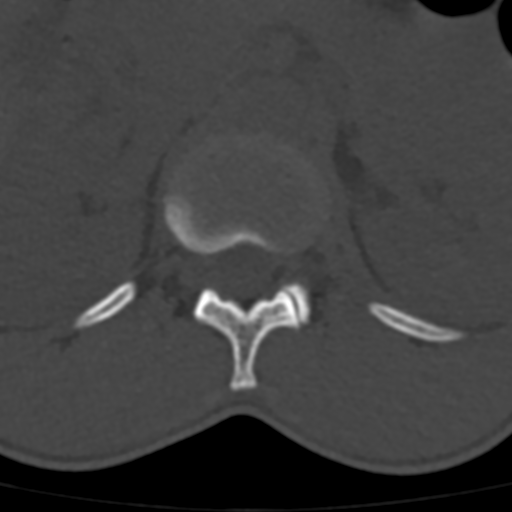

[Series 8: coronal st · coronal · 0.28mm/px · 3 of 61 slices shown]
[im 13/61  bone]
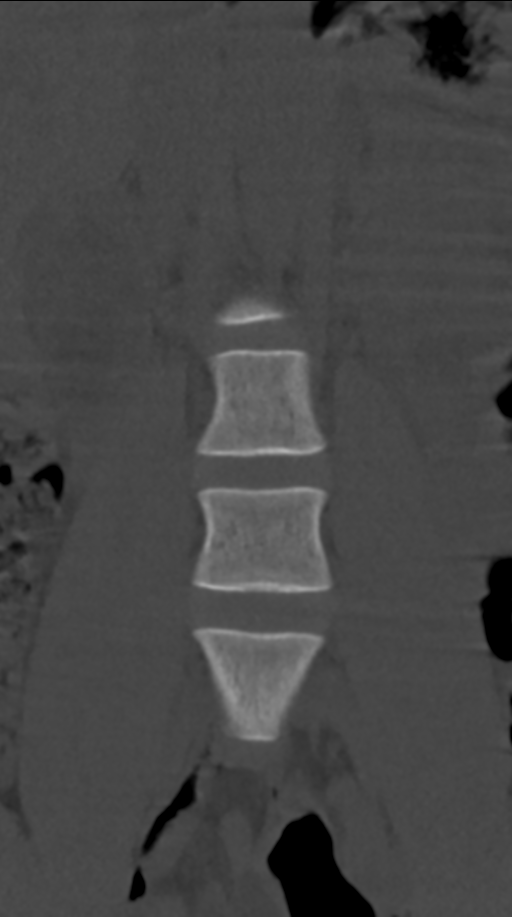
[im 25/61  bone]
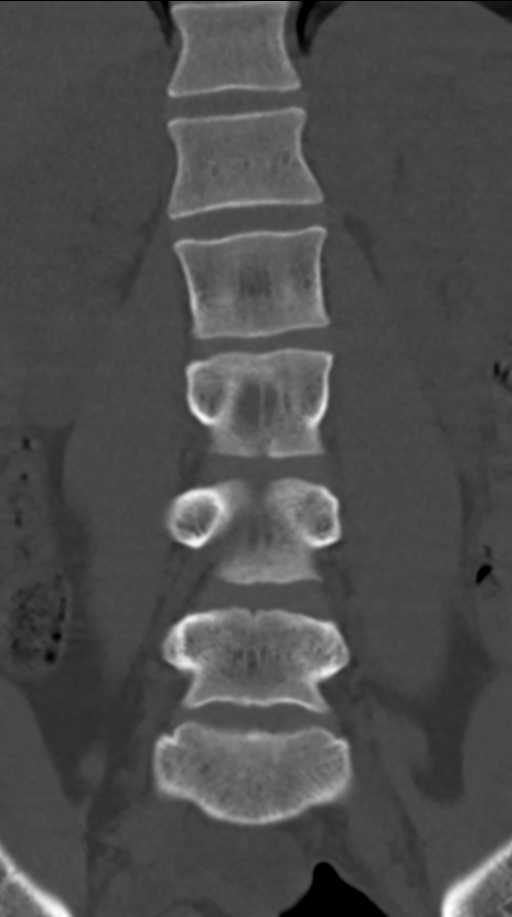
[im 37/61  bone]
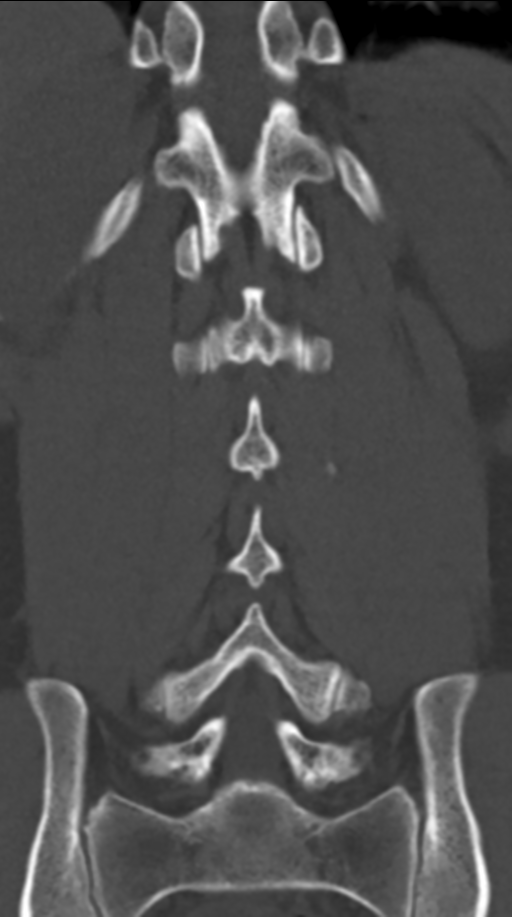

[Series 9: sagittal st · sagittal · 0.26mm/px · 5 of 61 slices shown, 6 images]
[im 21/61  bone]
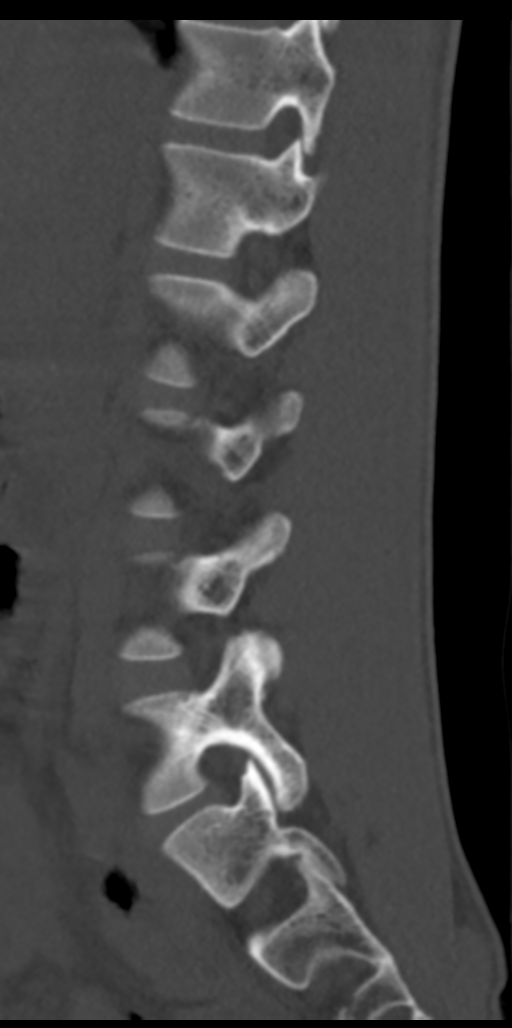
[im 26/61  bone]
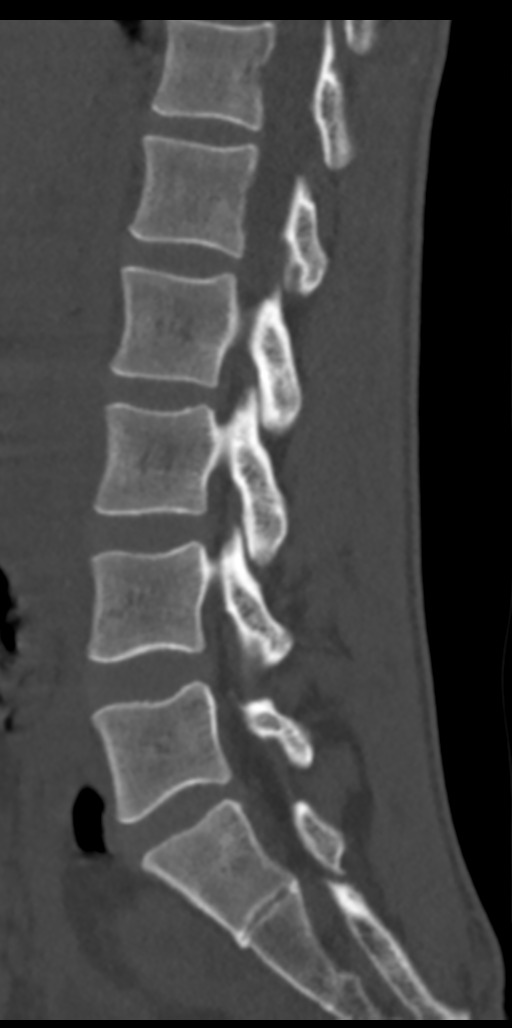
[im 31/61  soft-tissue]
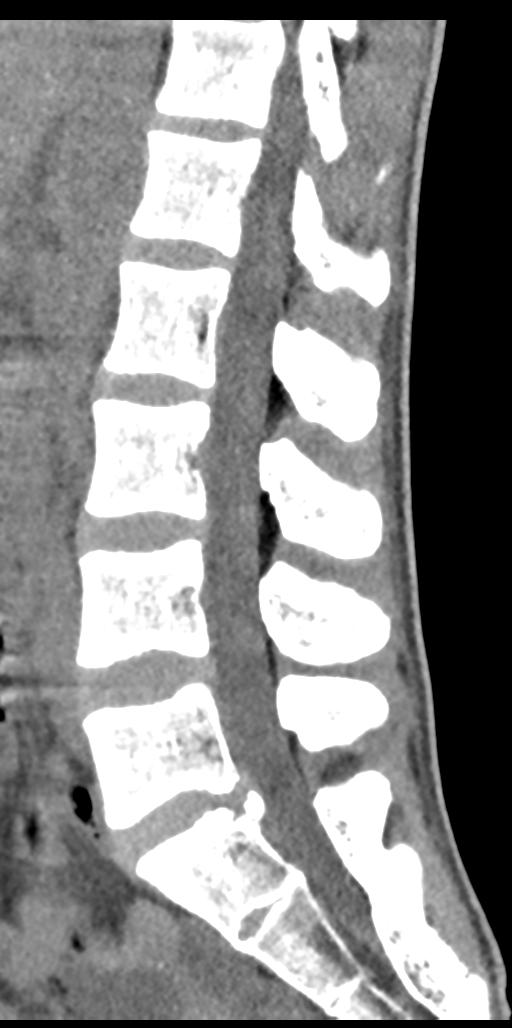
[im 31/61  bone]
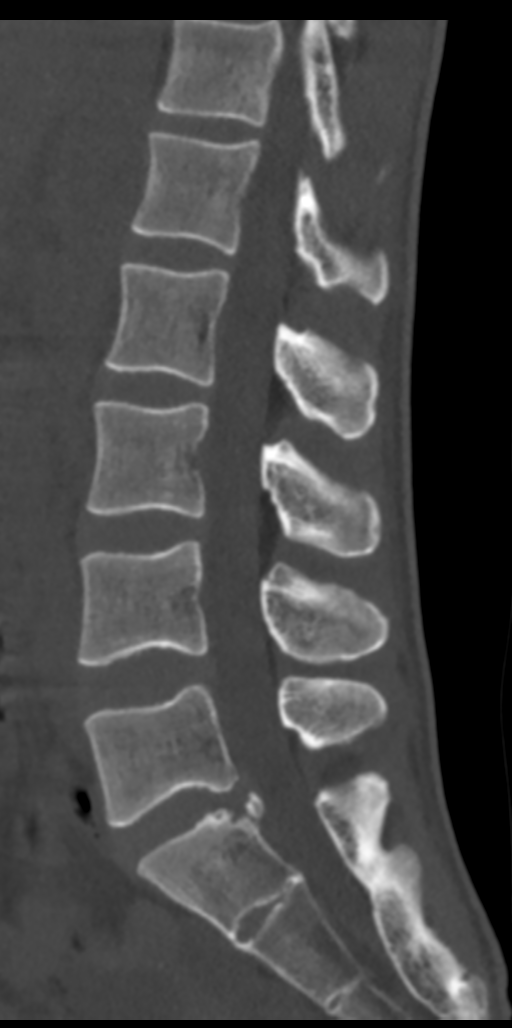
[im 36/61  bone]
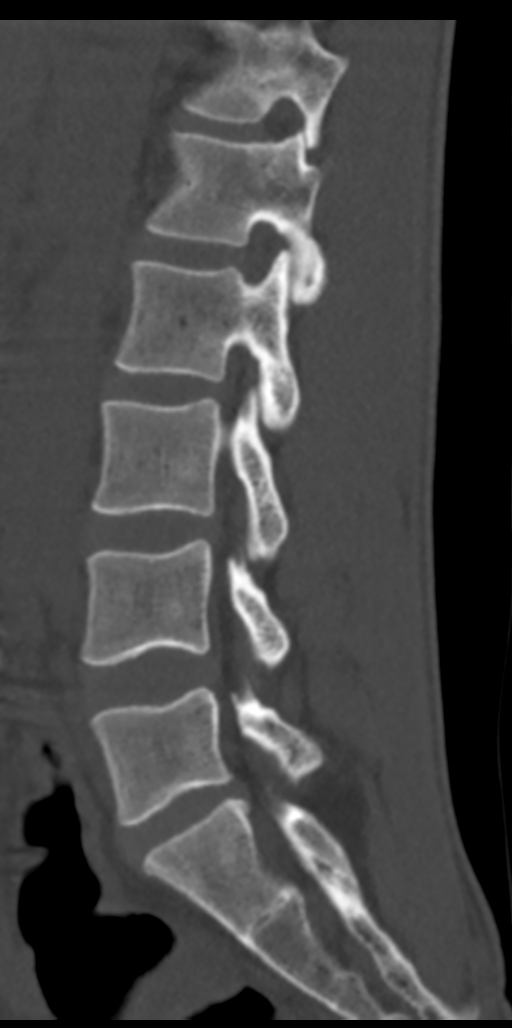
[im 41/61  bone]
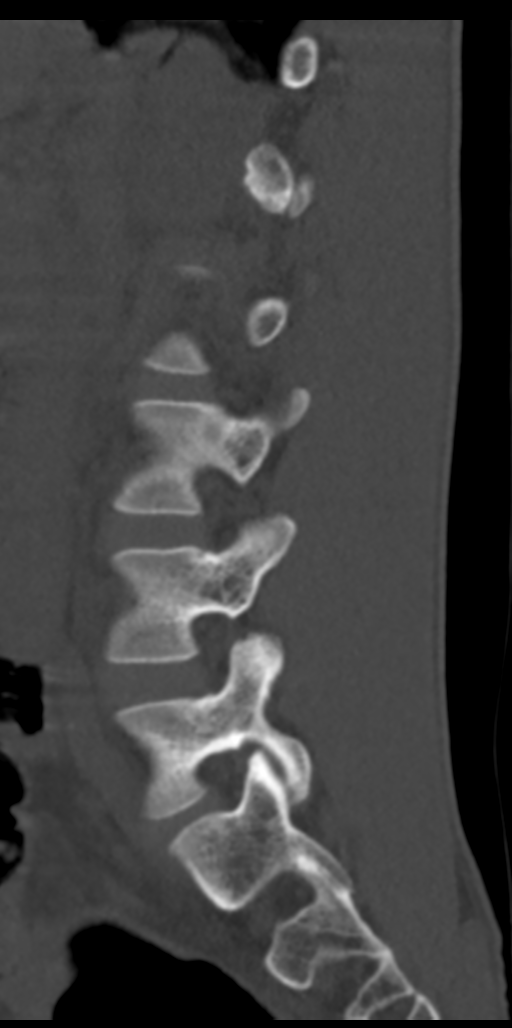

[10 of 33 positions shown; findings below may reference images not displayed]

FINDINGS: Segmentation: Standard.

Alignment: Maintained.

Vertebrae: No fracture or worrisome lesion. No pars interarticularis
defect.

Paraspinal and other soft tissues: Negative.

Disc levels: Disc height is maintained at all levels. Tiny Schmorl's
node in the superior endplate of L5 is noted. Very shallow disc
bulge is seen at L5-S1. Intervertebral disc spaces are otherwise
unremarkable. The central canal and foramina appear open at all
levels.
IMPRESSION: No acute abnormality.

Mild degenerative disc disease L5-S1 without central canal or
foraminal narrowing.

## 2020-03-09 ENCOUNTER — Encounter: Payer: Self-pay | Admitting: Family Medicine

## 2020-03-09 ENCOUNTER — Other Ambulatory Visit: Payer: Self-pay

## 2020-03-09 ENCOUNTER — Ambulatory Visit (INDEPENDENT_AMBULATORY_CARE_PROVIDER_SITE_OTHER): Payer: Medicaid Other | Admitting: Family Medicine

## 2020-03-09 VITALS — BP 110/60 | HR 53 | Ht 67.0 in | Wt 116.0 lb

## 2020-03-09 DIAGNOSIS — Z23 Encounter for immunization: Secondary | ICD-10-CM

## 2020-03-09 DIAGNOSIS — Z1159 Encounter for screening for other viral diseases: Secondary | ICD-10-CM | POA: Diagnosis not present

## 2020-03-09 DIAGNOSIS — Z114 Encounter for screening for human immunodeficiency virus [HIV]: Secondary | ICD-10-CM

## 2020-03-09 DIAGNOSIS — Z0000 Encounter for general adult medical examination without abnormal findings: Secondary | ICD-10-CM | POA: Diagnosis not present

## 2020-03-09 DIAGNOSIS — Z13220 Encounter for screening for lipoid disorders: Secondary | ICD-10-CM

## 2020-03-09 NOTE — Assessment & Plan Note (Addendum)
Vaccinations reviewed today -COVID-19, patient scheduled for second dose -Flu vaccine Screenings completed today, patient will be called with results. -HIV screen -Hep C screen -Lipid panel

## 2020-03-09 NOTE — Assessment & Plan Note (Signed)
Screening for lipid disorder.  Will call patient with results

## 2020-03-09 NOTE — Progress Notes (Signed)
Subjective:    Patient ID: Matthew Grimes, male    DOB: 1976/04/03, 44 y.o.   MRN: [id number]   CC: New Patient  HPI: Patient reports that he has no complaints at the moment.  He is wanting to establish care with a new practice, as he was not satisfied with his last practice due to difficulties with getting lab work and paperwork done.   PMHx: Past Medical History:  Diagnosis Date  . Arthritis   . Depression   . Migraines   -Patient reports he has a history of left hip arthritis, for which he takes Tylenol 650 mg as needed. -Patient reports isolated episodes of depressive thoughts/symptoms that do not last more than a few days and occur maybe 2 times per month. -Patient self reports history of fetal alcohol syndrome -Patient reports history of migraines with 2/week with photophobia and phonophobia. -Patient reports lactose intolerance with subsequent constipation if dairy products consumed.   Surgical Hx: History reviewed. No pertinent surgical history.   Family Hx: Family History  Problem Relation Age of Onset  . Cancer Mother   . Kidney disease Mother   . Asthma Sister   . Birth defects Sister   . Depression Sister   . Early death Sister   . Hypertension Sister   . Alcohol abuse Sister   . Drug abuse Sister   . Birth defects Brother   . Cancer Brother   . Depression Brother   . Alcohol abuse Brother   . Drug abuse Brother      Social Hx: Current Social History 03/09/2020  Who lives at home: Alone with cat   Who would speak for you about health care matters: Saintclair Halsted [phone]   Transportation: significant other has car  Important Relationships & Pets: Cat named Darreld Mclean, girlfriend named Systems developer Current Stressors: None reported   Work / Education:  [employer] not working Religious / Personal Beliefs: None  Interests / Fun: bowing, fishing, pool  Other: None   Tobacco: Smokes 2 cigarettes/week Alcohol: Reports occasional use with celebrations, but has  not had alcohol in the last year Drugs: Reports marijuana use 3 times per week as an appetite stimulant   Medications: Tylenol 650mg  BID as needed for arthritic hip pain  ROS: Positive for: Hip joint pain, constipation, problems with appetite, migraines  Negative for vision/hearing changes, weight change, fever, swallowing issues, chest pain, cough, dyspnea, GU symptoms, anxiety   Smoking status reviewed  Objective:  BP 110/60   Pulse (!) 53   Ht 5\' 7"  (1.702 m)   Wt 116 lb (52.6 kg)   SpO2 98%   BMI 18.17 kg/m  Vitals and nursing note reviewed  General: well nourished, in no acute distress HEENT: normocephalic, no scleral icterus or conjunctival pallor, no nasal discharge, moist mucous membranes Neck: supple, non-tender, without lymphadenopathy Cardiac: Bradycardia, clear S1 and S2, no murmurs, rubs, or gallops Respiratory: clear to auscultation bilaterally, no increased work of breathing Abdomen: soft, nontender, nondistended, no masses or organomegaly. Bowel sounds present Extremities: no edema or cyanosis. Warm, well perfused. 2+ radial and PT pulses bilaterally Skin: warm and dry, no rashes noted Neuro: alert and oriented, no focal deficits   Assessment & Plan:    Lipid screening Screening for lipid disorder.  Will call patient with results  Healthcare maintenance Vaccinations reviewed today -COVID-19, patient scheduled for second dose -Flu vaccine Screenings completed today, patient will be called with results. -HIV screen -Hep C screen -Lipid panel   No  follow-ups on file.   Evelena Leyden, DO, PGY-1

## 2020-03-09 NOTE — Patient Instructions (Addendum)
It was so great meeting you today. Today we discussed the following;  Labs: Today we are getting a lipid panel, HIV screening, hepatitis B screening.  We will call you with these results.  Otherwise, you seem to be doing really well.  I do recommend trying to stop smoking completely if you are able to.  If you would like help with this please let us know as there are many options.  If you have worsening of your migraines, please contact our office.

## 2020-03-10 LAB — LIPID PANEL
Chol/HDL Ratio: 4.9 ratio (ref 0.0–5.0)
Cholesterol, Total: 158 mg/dL (ref 100–199)
HDL: 32 mg/dL — ABNORMAL LOW (ref >39)
LDL Chol Calc (NIH): 110 mg/dL — ABNORMAL HIGH (ref 0–99)
Triglycerides: 81 mg/dL (ref 0–149)
VLDL Cholesterol Cal: 16 mg/dL (ref 5–40)

## 2020-03-10 LAB — HCV AB W REFLEX TO QUANT PCR: HCV Ab: <0.1 s/co ratio (ref 0.0–0.9)

## 2020-03-10 LAB — HCV INTERPRETATION

## 2020-03-10 LAB — HIV ANTIBODY (ROUTINE TESTING W REFLEX): HIV Screen 4th Generation wRfx: NONREACTIVE

## 2020-03-11 ENCOUNTER — Telehealth: Payer: Self-pay

## 2020-03-11 NOTE — Telephone Encounter (Signed)
Patient returns call to nurse line for lab results. Unable to find letter or result note. I am happy to give patient detailed message regarding lab results.   To PCP  Veronda Prude, RN

## 2020-03-11 NOTE — Progress Notes (Signed)
Patient was called with results.  Hep C and HIV screening were negative.  Patient's cholesterol is little elevated with an LDL of 110, HDL is low at 32.  Patient's current ASCVD score is 5.2% in the next 10 years, if patient is to cease smoking the risk drops down to 3.0%.  Discussed smoking cessation with the patient, he states that he thinks he is about to quit smoking and is currently only smoking 2 cigarettes/week.  At this time, will not initiate statin treatment.

## 2020-03-30 ENCOUNTER — Other Ambulatory Visit: Payer: Self-pay

## 2020-03-30 ENCOUNTER — Ambulatory Visit (INDEPENDENT_AMBULATORY_CARE_PROVIDER_SITE_OTHER): Payer: Medicaid Other

## 2020-03-30 DIAGNOSIS — Z23 Encounter for immunization: Secondary | ICD-10-CM

## 2020-03-30 NOTE — Progress Notes (Signed)
   Covid-19 Vaccination Clinic  Name:  KAMEN HANKEN    MRN: [id number] DOB: [birth year]  03/30/2020  Mr. Geiler was observed post Covid-19 immunization for 15 minutes without incident. He was provided with Vaccine Information Sheet and instruction to access the V-Safe system.   Mr. Scalia was instructed to call 911 with any severe reactions post vaccine: Marland Kitchen Difficulty breathing  . Swelling of face and throat  . A fast heartbeat  . A bad rash all over body  . Dizziness and weakness   #2 Covid Vaccine administered LD without complication.

## 2020-11-22 ENCOUNTER — Ambulatory Visit (INDEPENDENT_AMBULATORY_CARE_PROVIDER_SITE_OTHER): Payer: Medicaid Other | Admitting: Student

## 2020-11-22 ENCOUNTER — Encounter: Payer: Self-pay | Admitting: Student

## 2020-11-22 ENCOUNTER — Other Ambulatory Visit: Payer: Self-pay

## 2020-11-22 VITALS — BP 122/82 | HR 81 | Ht 67.0 in | Wt 107.0 lb

## 2020-11-22 DIAGNOSIS — G8929 Other chronic pain: Secondary | ICD-10-CM | POA: Diagnosis not present

## 2020-11-22 DIAGNOSIS — M25552 Pain in left hip: Secondary | ICD-10-CM

## 2020-11-22 MED ORDER — IBUPROFEN 400 MG PO TABS: 400.0000 mg | ORAL_TABLET | Freq: Four times a day (QID) | ORAL | 0 refills | Status: AC | PRN

## 2020-11-22 NOTE — Patient Instructions (Addendum)
It was great to see you! Thank you for allowing me to participate in your care!   Our plans for today:  - X-ray Hip - Tylenol every 4-6 hours as needed - Ibuprofen every 4-6 hours as needed  Schedule tylenol and ibuprofen at different times.  - Follow up in a month if things don't improve - I will send you a letter or give you a call about your imaging   Take care and seek immediate care sooner if you develop any concerns.   Dr. Bess Kinds, MD [facility]

## 2020-11-22 NOTE — Progress Notes (Signed)
    SUBJECTIVE:   CHIEF COMPLAINT / HPI:   Left Hip pain,  Pain changes from day to day ranging from 5-10, pain radiates down thigh and groin, is achy in nature. He takes some pills for arthritis (like tylenol) and muscle relaxer's. Hip is soar everyday, with pain 4 times out of the week. Limits his ability to walk or do activity, and sleep on his left side. Sometimes feels like its popping or catching. Appreciates that sometimes the hip seems to pop out and limit his mobility until it goes back in. Had this hip pain since 2019 ish. History of mild degenerative disc disease L5-S1 (05/19)     PERTINENT  PMH / PSH: Arthritis, depression, migraines.  OBJECTIVE:   There were no vitals taken for this visit. . Vitals:   11/22/20 0856  BP: 122/82  Pulse: 81  SpO2: 100%   Physcial Exam: Heart: Normal S1/S2, NRMG Lungs: WNL, CTABL, No stridor or wheezing Abdom: Soft, Non TTP, bowel sounds present Hip: Full range of motion, not limited by pain. Strength intact with knee raise, kicking/extending and pulling/ retracting leg. Tenderness in groin and hip bone Leg: walks with limp favoring left,   ASSESSMENT/PLAN:   No problem-specific Assessment & Plan notes found for this encounter.   Left Hip Pain: Chronic  Imaging: X-ray left hip  Pain Medicine: Ibuprofen / Tylenol alternating every 6 hours, prn  PT: Will consider referral to PT following imaging results.  Bess Kinds, MD [facility]

## 2020-12-16 ENCOUNTER — Ambulatory Visit (INDEPENDENT_AMBULATORY_CARE_PROVIDER_SITE_OTHER): Payer: Medicaid Other | Admitting: Family Medicine

## 2020-12-16 ENCOUNTER — Other Ambulatory Visit: Payer: Self-pay

## 2020-12-16 ENCOUNTER — Encounter: Payer: Self-pay | Admitting: Family Medicine

## 2020-12-16 ENCOUNTER — Ambulatory Visit (INDEPENDENT_AMBULATORY_CARE_PROVIDER_SITE_OTHER): Payer: Medicaid Other

## 2020-12-16 VITALS — BP 128/70 | HR 64 | Ht 67.0 in | Wt 112.5 lb

## 2020-12-16 DIAGNOSIS — G8929 Other chronic pain: Secondary | ICD-10-CM | POA: Diagnosis not present

## 2020-12-16 DIAGNOSIS — M25551 Pain in right hip: Secondary | ICD-10-CM | POA: Diagnosis present

## 2020-12-16 DIAGNOSIS — Z23 Encounter for immunization: Secondary | ICD-10-CM | POA: Diagnosis present

## 2020-12-16 NOTE — Patient Instructions (Addendum)
For your hip pain, what I want you to do is to complete the exercises and stretches that are given for several weeks and then see if you are having the same issue.  If the pain increases or changes and becomes more constant than I want you to come back sooner.  We can always consider getting an x-ray if it does start to worsen or if it becomes constant but I do not think we need one right now.  If this does not get any better with exercises we can consider going to physical therapy to seeing if they have anything else to offer.

## 2020-12-16 NOTE — Assessment & Plan Note (Signed)
Patient with chronic intermittent right hip pain associated with walking longer distances about 2-3 times per week.  Hip pain resolves with rest.  Physical exam overall normal with some weakness noted in right hip abductors and hip flexors.  Not concerning for arthritic changes at this moment, could be more related to weakness of the hip flexors in general versus friction on a tendon during ambulation.  Do not feel there is a need for x-rays at this time, will consider them if the pain increases, does not improve with strengthening exercises or becomes constant. - Patient given hip flexor strengthening exercises - Patient to follow-up in 1 month if pain is not improving with the exercises given - Return precautions given for worsening pain, increase in intensity or pain becomes constant - Continue ibuprofen and Tylenol as needed - Can consider Physical therapy if there is no improvement

## 2020-12-16 NOTE — Progress Notes (Signed)
    SUBJECTIVE:   CHIEF COMPLAINT / HPI:   Right hip pain Patient was seen on 7/25 regarding chronic left hip pain, patient reports that it is not been his left hip its been hurting it has been his right hip.  Patient did not get a hip x-ray at that time as he thought he needed an appointment, but do not think it is pertinent at the moment. Patient reports that this has been occurring for about 2 years the hip pain is intermittent and typically happens with the swing phase of the gait cycle-she reports that when he is picking up his right foot after total off is when he will occasionally feel a popping sensation that then limits some of his ability in the swing phase.  Occasionally the pain will radiate down to his knee as a burning sensation when this occurs, all symptoms resolved after rest.  Pain does not persist during periods of inactivity.  PERTINENT  PMH / PSH: Reviewed  OBJECTIVE:   BP 128/70   Pulse 64   Ht 5\' 7"  (1.702 m)   Wt 112 lb 8 oz (51 kg)   SpO2 99%   BMI 17.62 kg/m   General: NAD, well-appearing, well-nourished Respiratory: No respiratory distress, breathing comfortably, able to speak in full sentences Skin: warm and dry, no rashes noted on exposed skin Psych: Appropriate affect and mood MSK: Right Hip:  - Inspection: No gross deformity, no swelling, erythema, or ecchymosis - Palpation: No TTP, specifically none over greater trochanter - ROM: Normal range of motion on Flexion, extension, abduction, internal and external rotation - Strength: Normal strength but strength is able to be overcome with increased resistance in hip flexion and abduction  - Neuro/vasc: NV intact distally, brisk patellar reflexes noted - Special Tests: Negative FABER and FADIR, though patient notes some tightness in his quadriceps and . Negative Trendelenberg.  Negative Ober's.   ASSESSMENT/PLAN:   Chronic right hip pain Patient with chronic intermittent right hip pain associated with  walking longer distances about 2-3 times per week.  Hip pain resolves with rest.  Physical exam overall normal with some weakness noted in right hip abductors and hip flexors.  Not concerning for arthritic changes at this moment, could be more related to weakness of the hip flexors in general versus friction on a tendon during ambulation.  Do not feel there is a need for x-rays at this time, will consider them if the pain increases, does not improve with strengthening exercises or becomes constant. - Patient given hip flexor strengthening exercises - Patient to follow-up in 1 month if pain is not improving with the exercises given - Return precautions given for worsening pain, increase in intensity or pain becomes constant - Continue ibuprofen and Tylenol as needed - Can consider Physical therapy if there is no improvement    COVID vaccine Booster administered today   , DO [facility]
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
## 2021-03-09 ENCOUNTER — Other Ambulatory Visit: Payer: Self-pay

## 2021-03-09 ENCOUNTER — Ambulatory Visit (INDEPENDENT_AMBULATORY_CARE_PROVIDER_SITE_OTHER): Payer: Medicaid Other

## 2021-03-09 DIAGNOSIS — Z23 Encounter for immunization: Secondary | ICD-10-CM | POA: Diagnosis present

## 2021-03-09 NOTE — Progress Notes (Signed)
Patient presents to nurse clinic for flu vaccination. Administered in RD, site unremarkable, tolerated injection well.   Lillyan Hitson C Rei Contee, RN  

## 2021-05-04 NOTE — Progress Notes (Signed)
° ° °  SUBJECTIVE:   CHIEF COMPLAINT / HPI:   Cold Symptoms Matthew Grimes is a 46 y.o. male who presents to the [facility] clinic to discuss cold symptoms x1 week.  States that he has had a productive yellow cough, subjective fever, stomachache, felt lightheaded, and body aches.  His girlfriend is present with him and notes that she was sick with similar symptoms in December.  He states he has been taking Mucinex and Tylenol (which he normally takes for his arthritis) which has been helping.  States that yesterday his symptoms started clearing up some.  He denies feeling short of breath.  He has been COVID vaccinated x3 and fully vaccinated.   PERTINENT  PMH / PSH:  Past Medical History:  Diagnosis Date   Arthritis    Depression    Migraines     OBJECTIVE:   BP 114/78    Pulse (!) 52    Wt 122 lb (55.3 kg)    SpO2 100%    BMI 19.11 kg/m    General: NAD, pleasant, able to participate in exam HEENT: Normocephalic, poor dentition, oropharynx clear without erythema or tonsillar exudates Neck: Supple, no cervical lymphadenopathy, no thyromegaly Cardiac: RRR, no murmurs. Respiratory: CTAB, normal effort, No wheezes, rales or rhonchi Abdomen: Bowel sounds present, nontender, non-distended Extremities: no edema or cyanosis. Skin: warm and dry, no rashes noted Psych: Normal affect and mood  ASSESSMENT/PLAN:   Viral URI with cough History and physical exam point to a more viral etiology.  Patient is well-appearing, vitals are stable.  He is saturating under percent on room air and lungs sound clear.  Given that his significant other was recently sick with similar symptoms and has improved without the need for antibiotics, I suspect he likely has similar viral infection.  Also, given the fact that he is improving as well with conservative treatments. -Continue supportive care -Return precautions discussed -Defer viral testing -Encouraged handwashing to prevent recurrent illnesses      Sharion Settler, [place]

## 2021-05-06 ENCOUNTER — Ambulatory Visit (INDEPENDENT_AMBULATORY_CARE_PROVIDER_SITE_OTHER): Payer: Medicaid Other | Admitting: Family Medicine

## 2021-05-06 ENCOUNTER — Other Ambulatory Visit: Payer: Self-pay

## 2021-05-06 DIAGNOSIS — J069 Acute upper respiratory infection, unspecified: Secondary | ICD-10-CM | POA: Insufficient documentation

## 2021-05-06 NOTE — Assessment & Plan Note (Signed)
History and physical exam point to a more viral etiology.  Patient is well-appearing, vitals are stable.  He is saturating under percent on room air and lungs sound clear.  Given that his significant other was recently sick with similar symptoms and has improved without the need for antibiotics, I suspect he likely has similar viral infection.  Also, given the fact that he is improving as well with conservative treatments. -Continue supportive care -Return precautions discussed -Defer viral testing -Encouraged handwashing to prevent recurrent illnesses

## 2021-05-06 NOTE — Patient Instructions (Signed)
It was so good to see you today! I am sorry that you are not feeling well.   This is most likely a viral infection. This will take time to get over. The treatment for this is supportive care. You can alternate Tylenol and Ibuprofen for pain or fever every 3 hours (there should be 6 hours in between each dose of Tylenol, and 6 hours in between doses of Ibuprofen). You can take a teaspoon of honey by itself or mixed with water to help the cough. Steam baths, Vicks vapor rub, a humidifier and nasal saline spray can help with congestion.   It is important to keep hydrated throughout this time!  Frequent hand washing to prevent recurrent illnesses is important.   If you develop any shortness of breath, high fevers, excess vomiting preventing you from consuming fluids, or other concerning symptoms please return or go to the emergency department.

## 2021-10-04 ENCOUNTER — Encounter: Payer: Self-pay | Admitting: *Deleted

## 2022-02-09 ENCOUNTER — Ambulatory Visit (INDEPENDENT_AMBULATORY_CARE_PROVIDER_SITE_OTHER): Payer: Medicaid Other

## 2022-02-09 DIAGNOSIS — Z23 Encounter for immunization: Secondary | ICD-10-CM

## 2022-02-10 NOTE — Progress Notes (Signed)
Patient presents to nurse clinic for flu vaccination. Administered in RD, site unremarkable, tolerated injection well.   Soleil Mas C Cailey Trigueros, RN  

## 2022-03-21 ENCOUNTER — Encounter: Payer: Self-pay | Admitting: Student

## 2022-03-21 ENCOUNTER — Ambulatory Visit (INDEPENDENT_AMBULATORY_CARE_PROVIDER_SITE_OTHER): Payer: Medicaid Other | Admitting: Student

## 2022-03-21 VITALS — BP 125/86 | HR 65 | Temp 98.1°F | Ht 67.0 in | Wt 115.8 lb

## 2022-03-21 DIAGNOSIS — N159 Renal tubulo-interstitial disease, unspecified: Secondary | ICD-10-CM

## 2022-03-21 DIAGNOSIS — N3000 Acute cystitis without hematuria: Secondary | ICD-10-CM | POA: Diagnosis not present

## 2022-03-21 LAB — POCT URINALYSIS DIP (MANUAL ENTRY)
Bilirubin, UA: NEGATIVE
Glucose, UA: NEGATIVE mg/dL
Ketones, POC UA: NEGATIVE mg/dL
Nitrite, UA: NEGATIVE
Spec Grav, UA: 1.01 (ref 1.010–1.025)
Urobilinogen, UA: 0.2 E.U./dL
pH, UA: 6 (ref 5.0–8.0)

## 2022-03-21 LAB — POCT UA - MICROSCOPIC ONLY: RBC, Urine, Miroscopic: >20 (ref 0–2)

## 2022-03-21 MED ORDER — CEPHALEXIN 500 MG PO CAPS: 500.0000 mg | ORAL_CAPSULE | Freq: Three times a day (TID) | ORAL | 0 refills | Status: AC

## 2022-03-21 NOTE — Progress Notes (Signed)
    SUBJECTIVE:   CHIEF COMPLAINT / HPI:   46 y.o.  year old male presents with complaint of irritation with voiding that started today.  He also endorses increased frequency and urgency.  He is sexually active with one partner and no protection. Endorses fever, back pain but report this is attributed to URI symptom because the family is currently 26 month daughter was diagnosed with RSV. Reports mild  hematuria and no recent change in medication.    PERTINENT  PMH / PSH: Reviewed   OBJECTIVE:   BP 125/86   Pulse 65   Temp 98.1 F (36.7 C)   Ht 5\' 7"  (1.702 m)   Wt 115 lb 12.8 oz (52.5 kg)   SpO2 100%   BMI 18.14 kg/m    Physical Exam General: Alert, well appearing, NAD Cardiovascular: RRR, No Murmurs, Normal S2/S2 Respiratory: CTAB, No wheezing or Rales Musculoskeletal: No CVA tenderness  ASSESSMENT/PLAN:   Dysuria Patient complaining of dysuria and increased urinary frequency that started today. No closed tenderness making pyelonephritis less likely.  POCT dipstick showed moderate WBC and RBC. Will obtain urine culture for a more definitive result.  Given this is his third UTI over his lifetime, discussed with patient that should he have recurrent UTI which is uncommon in males we could consider urology referral if indicated.  Started patient on Keflex 500 mg TID for 7  days.   , MD [facility]

## 2022-03-21 NOTE — Patient Instructions (Addendum)
It was wonderful to meet you today. Thank you for allowing me to be a part of your care. Below is a short summary of what we discussed at your visit today:  Your pain with voiding and lab came back to showed that you have a urinary tract infection.  I have sent in prescription for antibiotics which you will take Keflex 500 mg 3 times daily for 7 days.  If you have any questions or concerns, please do not hesitate to contact us via phone or MyChart message.   Jerre Simon, MD [facility]

## 2022-03-23 LAB — URINE CULTURE

## 2022-06-04 ENCOUNTER — Emergency Department (HOSPITAL_COMMUNITY)
Admission: EM | Admit: 2022-06-04 | Discharge: 2022-06-04 | Disposition: A | Discharge status: Home / Self Care | Payer: Medicaid Other | Attending: Emergency Medicine | Admitting: Emergency Medicine

## 2022-06-04 ENCOUNTER — Other Ambulatory Visit: Payer: Self-pay

## 2022-06-04 DIAGNOSIS — N3001 Acute cystitis with hematuria: Secondary | ICD-10-CM | POA: Insufficient documentation

## 2022-06-04 DIAGNOSIS — R319 Hematuria, unspecified: Secondary | ICD-10-CM | POA: Diagnosis present

## 2022-06-04 LAB — URINALYSIS, ROUTINE W REFLEX MICROSCOPIC
Bacteria, UA: NONE SEEN
Bilirubin Urine: NEGATIVE
Glucose, UA: NEGATIVE mg/dL
Ketones, ur: NEGATIVE mg/dL
Nitrite: NEGATIVE
Protein, ur: 100 mg/dL — AB
RBC / HPF: >50 RBC/hpf (ref 0–5)
Specific Gravity, Urine: 1.016 (ref 1.005–1.030)
WBC, UA: >50 WBC/hpf (ref 0–5)
pH: 6 (ref 5.0–8.0)

## 2022-06-04 MED ORDER — CEPHALEXIN 250 MG PO CAPS
1000.0000 mg | ORAL_CAPSULE | Freq: Once | ORAL | Status: AC
  Administered 2022-06-04: 1000 mg via ORAL
  Filled 2022-06-04: qty 4

## 2022-06-04 MED ORDER — CEPHALEXIN 500 MG PO CAPS: ORAL_CAPSULE | ORAL | 0 refills | Status: AC

## 2022-06-04 NOTE — Discharge Instructions (Addendum)
Your urine does look like it could be infected.  Please take the antibiotics as prescribed.  As we discussed this is not a common thing for men.  Please call the urologist to set up an appointment to be evaluated for the reasoning why you are having recurrent urinary tract infections.

## 2022-06-04 NOTE — ED Provider Notes (Signed)
[facility] Provider Note   CSN: [id number] Arrival date & time: 06/04/22  4098     History  Chief Complaint  Patient presents with   Hematuria    Matthew Grimes is a 47 y.o. male.  47 yo M with a chief complaints of suprapubic discomfort dysuria and hematuria.  The patient tells me that he had multiple urinary tract infections.  Thinks this feels the same.   Hematuria       Home Medications Prior to Admission medications   Medication Sig Start Date End Date Taking? Authorizing Provider  cephALEXin (KEFLEX) 500 MG capsule 2 caps po bid x 7 days 06/04/22  Yes Deno Etienne, DO  acetaminophen (TYLENOL) 650 MG CR tablet Take 650 mg by mouth every 8 (eight) hours as needed for pain.    [provider]  ibuprofen (ADVIL) 400 MG tablet Take 1 tablet (400 mg total) by mouth every 6 (six) hours as needed. Patient not taking: Reported on 05/06/2021 11/22/20   Holley Bouche, MD      Allergies    Patient has no known allergies.    Review of Systems   Review of Systems  Genitourinary:  Positive for hematuria.    Physical Exam Updated Vital Signs There were no vitals taken for this visit. Physical Exam Vitals and nursing note reviewed.  Constitutional:      Appearance: He is well-developed.  HENT:     Head: Normocephalic and atraumatic.  Eyes:     Pupils: Pupils are equal, round, and reactive to light.  Neck:     Vascular: No JVD.  Cardiovascular:     Rate and Rhythm: Normal rate and regular rhythm.     Heart sounds: No murmur heard.    No friction rub. No gallop.  Pulmonary:     Effort: No respiratory distress.     Breath sounds: No wheezing.  Abdominal:     General: There is no distension.     Tenderness: There is no abdominal tenderness. There is no right CVA tenderness, left CVA tenderness, guarding or rebound.  Musculoskeletal:        General: Normal range of motion.     Cervical back: Normal range of motion  and neck supple.  Skin:    Coloration: Skin is not pale.     Findings: No rash.  Neurological:     Mental Status: He is alert and oriented to person, place, and time.  Psychiatric:        Behavior: Behavior normal.     ED Results / Procedures / Treatments   Labs (all labs ordered are listed, but only abnormal results are displayed) Labs Reviewed  URINALYSIS, ROUTINE W REFLEX MICROSCOPIC - Abnormal; Notable for the following components:      Result Value   Color, Urine RED (*)    APPearance CLOUDY (*)    Hgb urine dipstick MODERATE (*)    Protein, ur 100 (*)    Leukocytes,Ua MODERATE (*)    All other components within normal limits    EKG None  Radiology No results found.  Procedures Procedures    Medications Ordered in ED Medications  cephALEXin (KEFLEX) capsule 1,000 mg (has no administration in time range)    ED Course/ Medical Decision Making/ A&P                             Medical Decision Making Amount  and/or Complexity of Data Reviewed Labs: ordered.   47 yo M with a chief complaints of dysuria hematuria going on for a couple days.  He denies any concerning sexual contacts.  Denies history of STD.  This is now at least the patient's fifth visit to a provider for urinary tract symptoms.  Will give him information for the urologist.  UA with too numerous to count white cells, will treat for possible urinary tract infection.  7:41 AM:  I have discussed the diagnosis/risks/treatment options with the patient.  Evaluation and diagnostic testing in the emergency department does not suggest an emergent condition requiring admission or immediate intervention beyond what has been performed at this time.  They will follow up with PCP, urology. We also discussed returning to the ED immediately if new or worsening sx occur. We discussed the sx which are most concerning (e.g., sudden worsening pain, fever, inability to tolerate by mouth) that necessitate immediate return.  Medications administered to the patient during their visit and any new prescriptions provided to the patient are listed below.  Medications given during this visit Medications  cephALEXin (KEFLEX) capsule 1,000 mg (has no administration in time range)     The patient appears reasonably screen and/or stabilized for discharge and I doubt any other medical condition or other [facility] requiring further screening, evaluation, or treatment in the ED at this time prior to discharge.          Final Clinical Impression(s) / ED Diagnoses Final diagnoses:  Acute cystitis with hematuria    Rx / DC Orders ED Discharge Orders          Ordered    cephALEXin (KEFLEX) 500 MG capsule        06/04/22 0740              Deno Etienne, DO 06/04/22 [id number]

## 2022-06-04 NOTE — ED Triage Notes (Signed)
Pt reports at 0530 he had blood in his urine. "I have a UTI because I drunk a coke last night".

## 2022-10-07 ENCOUNTER — Ambulatory Visit (HOSPITAL_COMMUNITY)
Admission: RE | Admit: 2022-10-07 | Discharge: 2022-10-07 | Disposition: A | Discharge status: Home / Self Care | Payer: Medicaid Other | Source: Ambulatory Visit | Attending: Internal Medicine | Admitting: Internal Medicine

## 2022-10-07 ENCOUNTER — Encounter (HOSPITAL_COMMUNITY): Payer: Self-pay

## 2022-10-07 VITALS — BP 130/76 | HR 70 | Temp 97.9°F | Resp 14

## 2022-10-07 DIAGNOSIS — N3001 Acute cystitis with hematuria: Secondary | ICD-10-CM | POA: Diagnosis not present

## 2022-10-07 LAB — POCT URINALYSIS DIP (MANUAL ENTRY)
Glucose, UA: NEGATIVE mg/dL
Ketones, POC UA: NEGATIVE mg/dL
Nitrite, UA: POSITIVE — AB
Protein Ur, POC: 100 mg/dL — AB
Spec Grav, UA: >=1.03 — AB (ref 1.010–1.025)
Urobilinogen, UA: 0.2 E.U./dL
pH, UA: 6 (ref 5.0–8.0)

## 2022-10-07 MED ORDER — SULFAMETHOXAZOLE-TRIMETHOPRIM 800-160 MG PO TABS: 1.0000 | ORAL_TABLET | Freq: Two times a day (BID) | ORAL | 0 refills | Status: AC

## 2022-10-07 NOTE — ED Triage Notes (Signed)
Pt reports blood in urine for 4-5 days. Reports took medication that girlfriend gave him that helped.

## 2022-10-07 NOTE — ED Provider Notes (Signed)
[facility]    CSN: [id number] Arrival date & time: 10/07/22  1351      History   Chief Complaint Chief Complaint  Patient presents with   Hematuria    HPI Matthew Grimes is a 47 y.o. male.   Patient presents to urgent care for evaluation of blood in the urine that started 1 week ago. He states he sees "pink stuff" in his urine mostly in the mornings but states when he voids at night it has "cleared up". Reports dysuria at the end of his urinary stream and urinary frequency for the last 1 week as well. Denies urinary hesitancy, weak urinary stream, fevers, chills, nausea, vomiting, or dizziness. Reports suprapubic abdominal pain at a 4/10 associated with voiding. Denies penile discharge, itch, and odor. He drinks a lot of soda and sweet tea but states he has cut back in recent months. Current everyday cigarette smoker and states he only smokes one cigarette per day (cut back recently).  He is not diabetic, denies history of immunosuppression, and does not take an SGLT2 inhibitor.  He has been taking AZO given to him by his girlfriend and states this helped a little bit.    Hematuria    Past Medical History:  Diagnosis Date   Arthritis    Depression    Migraines     Patient Active Problem List   Diagnosis Date Noted   Viral URI with cough 05/06/2021   Chronic right hip pain 12/16/2020   Lipid screening 03/09/2020   Healthcare maintenance 03/09/2020    History reviewed. No pertinent surgical history.     Home Medications    Prior to Admission medications   Medication Sig Start Date End Date Taking? Authorizing Provider  sulfamethoxazole-trimethoprim (BACTRIM DS) 800-160 MG tablet Take 1 tablet by mouth 2 (two) times daily for 7 days. 10/07/22 10/14/22 Yes StanhopeDonavan Burnet, FNP  acetaminophen (TYLENOL) 650 MG CR tablet Take 650 mg by mouth every 8 (eight) hours as needed for pain.    [provider]  cephALEXin (KEFLEX) 500 MG capsule 2 caps po  bid x 7 days 06/04/22   Melene Plan, DO  ibuprofen (ADVIL) 400 MG tablet Take 1 tablet (400 mg total) by mouth every 6 (six) hours as needed. Patient not taking: Reported on 05/06/2021 11/22/20   Bess Kinds, MD    Family History Family History  Problem Relation Age of Onset   Cancer Mother    Kidney disease Mother    Asthma Sister    Birth defects Sister    Depression Sister    Early death Sister    Hypertension Sister    Alcohol abuse Sister    Drug abuse Sister    Birth defects Brother    Cancer Brother    Depression Brother    Alcohol abuse Brother    Drug abuse Brother     Social History Social History   Tobacco Use   Smoking status: Every Day    Types: Cigarettes   Smokeless tobacco: Never   Tobacco comments:    2 cigs / week  Vaping Use   Vaping Use: Never used  Substance Use Topics   Alcohol use: Not Currently   Drug use: Yes    Types: Marijuana    Comment: uses as appetite stimulation     Allergies   Patient has no known allergies.   Review of Systems Review of Systems  Genitourinary:  Positive for hematuria.  Per HPI  Physical Exam Triage Vital Signs ED Triage Vitals  Enc Vitals Group     BP 10/07/22 1405 130/76     Pulse Rate 10/07/22 1405 70     Resp 10/07/22 1405 14     Temp 10/07/22 1405 97.9 F (36.6 C)     Temp Source 10/07/22 1405 Oral     SpO2 10/07/22 1405 97 %     Weight --      Height --      Head Circumference --      Peak Flow --      Pain Score 10/07/22 1404 0     Pain Loc --      Pain Edu? --      Excl. in GC? --    No data found.  Updated Vital Signs BP 130/76 (BP Location: Right Arm)   Pulse 70   Temp 97.9 F (36.6 C) (Oral)   Resp 14   SpO2 97%   Visual Acuity Right Eye Distance:   Left Eye Distance:   Bilateral Distance:    Right Eye Near:   Left Eye Near:    Bilateral Near:     Physical Exam Vitals and nursing note reviewed.  Constitutional:      Appearance: He is not ill-appearing or  toxic-appearing.  HENT:     Head: Normocephalic and atraumatic.     Right Ear: Hearing and external ear normal.     Left Ear: Hearing and external ear normal.     Nose: Nose normal.     Mouth/Throat:     Lips: Pink.  Eyes:     General: Lids are normal. Vision grossly intact. Gaze aligned appropriately.     Extraocular Movements: Extraocular movements intact.     Conjunctiva/sclera: Conjunctivae normal.  Pulmonary:     Effort: Pulmonary effort is normal.  Abdominal:     General: Bowel sounds are normal.     Palpations: Abdomen is soft.     Tenderness: There is no abdominal tenderness. There is no right CVA tenderness, left CVA tenderness or guarding.  Musculoskeletal:     Cervical back: Neck supple.     Right lower leg: No edema.     Left lower leg: No edema.  Skin:    General: Skin is warm and dry.     Capillary Refill: Capillary refill takes less than 2 seconds.     Findings: No rash.  Neurological:     General: No focal deficit present.     Mental Status: He is alert and oriented to person, place, and time. Mental status is at baseline.     Cranial Nerves: No dysarthria or facial asymmetry.  Psychiatric:        Mood and Affect: Mood normal.        Speech: Speech normal.        Behavior: Behavior normal.        Thought Content: Thought content normal.        Judgment: Judgment normal.      UC Treatments / Results  Labs (all labs ordered are listed, but only abnormal results are displayed) Labs Reviewed  POCT URINALYSIS DIP (MANUAL ENTRY) - Abnormal; Notable for the following components:      Result Value   Color, UA straw (*)    Clarity, UA cloudy (*)    Bilirubin, UA small (*)    Spec Grav, UA >=1.030 (*)    Blood, UA large (*)    Protein Ur, POC =100 (*)  Nitrite, UA Positive (*)    Leukocytes, UA Small (1+) (*)    All other components within normal limits  URINE CULTURE  CYTOLOGY, (ORAL, ANAL, URETHRAL) ANCILLARY ONLY    EKG   Radiology No results  found.  Procedures Procedures (including critical care time)  Medications Ordered in UC Medications - No data to display  Initial Impression / Assessment and Plan / UC Course  I have reviewed the triage vital signs and the nursing notes.  Pertinent labs & imaging results that were available during my care of the patient were reviewed by me and considered in my medical decision making (see chart for details).   1.  Acute cystitis with hematuria Presentation is consistent with acute cystitis with hematuria based on urinalysis results.  Urine culture is pending.  No systemic signs of infection.  Currently afebrile with hemodynamically stable vital signs.  Low suspicion for prostatitis, pyelonephritis, and nephrolithiasis.  Bactrim antibiotic twice daily for the next 7 days sent to pharmacy to be taken as prescribed.  Will change antibiotic as indicated based on urine culture if needed.  May continue AZO as needed for dysuria.  He has had urinary tract infection in the past in February 2024.  Suspect frequent intake of sweet tea/other urinary irritants is contributing to this.  Advised to increase water intake and decrease the amount of urinary irritants he is ingesting.  Patient given follow-up information for [facility] given frequent urinary tract infections recently.  He is agreeable with plan.  Discussed physical exam and available lab work findings in clinic with patient.  Counseled patient regarding appropriate use of medications and potential side effects for all medications recommended or prescribed today. Discussed red flag signs and symptoms of worsening condition,when to call the PCP office, return to urgent care, and when to seek higher level of care in the emergency department. Patient verbalizes understanding and agreement with plan. All questions answered. Patient discharged in stable condition.    Final Clinical Impressions(s) / UC Diagnoses   Final diagnoses:  Acute  cystitis with hematuria     Discharge Instructions      Your urine shows you likely have a urinary tract infection. I have sent your urine for culture to confirm this. We will go ahead and have you start taking antibiotics due to your symptoms.  Take antibiotic as directed. Bactrim antibiotic twice daily for 7 days.  If you develop diarrhea while taking this medication you may purchase an over-the-counter probiotic or eat yogurt with live active cultures.  To avoid GI upset please take this medication with food. I have sent your urine for culture to see what type of bacteria grows. We will call you if we need to change the treatment plan based on the results of your urine culture.  Please schedule an appointment with [facility] for follow-up. You will need a referral from your primary care provider to do this, so schedule an appointment with PCP as well.  If you develop any new or worsening symptoms or do not improve in the next 2 to 3 days, please return.  If your symptoms are severe, please go to the emergency room.  Follow-up with your primary care provider for further evaluation and management of your symptoms as well as ongoing wellness visits.  I hope you feel better!      ED Prescriptions     Medication Sig Dispense Auth. Provider   sulfamethoxazole-trimethoprim (BACTRIM DS) 800-160 MG tablet Take 1 tablet by mouth  2 (two) times daily for 7 days. 14 tablet Carlisle Beers, FNP      PDMP not reviewed this encounter.   Carlisle Beers, [place] 10/07/22 [phone]

## 2022-10-07 NOTE — Discharge Instructions (Addendum)
Your urine shows you likely have a urinary tract infection. I have sent your urine for culture to confirm this. We will go ahead and have you start taking antibiotics due to your symptoms.  Take antibiotic as directed. Bactrim antibiotic twice daily for 7 days.  If you develop diarrhea while taking this medication you may purchase an over-the-counter probiotic or eat yogurt with live active cultures.  To avoid GI upset please take this medication with food. I have sent your urine for culture to see what type of bacteria grows. We will call you if we need to change the treatment plan based on the results of your urine culture.  Please schedule an appointment with [facility] for follow-up. You will need a referral from your primary care provider to do this, so schedule an appointment with PCP as well.  If you develop any new or worsening symptoms or do not improve in the next 2 to 3 days, please return.  If your symptoms are severe, please go to the emergency room.  Follow-up with your primary care provider for further evaluation and management of your symptoms as well as ongoing wellness visits.  I hope you feel better!

## 2022-10-09 LAB — URINE CULTURE: Culture: 70000 — AB

## 2023-02-02 ENCOUNTER — Ambulatory Visit: Payer: Medicaid Other

## 2023-02-08 ENCOUNTER — Ambulatory Visit (INDEPENDENT_AMBULATORY_CARE_PROVIDER_SITE_OTHER): Payer: MEDICAID

## 2023-02-08 ENCOUNTER — Ambulatory Visit: Payer: MEDICAID

## 2023-02-08 DIAGNOSIS — Z23 Encounter for immunization: Secondary | ICD-10-CM

## 2023-02-08 NOTE — Progress Notes (Signed)
Patient presents to nurse clinic for Flu vaccine.  Vaccine administered without complication.  See admin for details.

## 2023-04-09 ENCOUNTER — Ambulatory Visit: Payer: MEDICAID | Admitting: Student
